# Patient Record
Sex: Male | Born: 1980 | Race: White | Hispanic: No | Marital: Married | Smoking: Never smoker
Health system: Southern US, Community
[De-identification: ages and names within clinical notes are randomized; demographics above are authoritative.]

## PROBLEM LIST (undated history)

## (undated) DIAGNOSIS — IMO0001 Reserved for inherently not codable concepts without codable children: Secondary | ICD-10-CM

## (undated) DIAGNOSIS — Z8719 Personal history of other diseases of the digestive system: Secondary | ICD-10-CM

## (undated) DIAGNOSIS — A6002 Herpesviral infection of other male genital organs: Secondary | ICD-10-CM

## (undated) DIAGNOSIS — T7840XA Allergy, unspecified, initial encounter: Secondary | ICD-10-CM

## (undated) DIAGNOSIS — F419 Anxiety disorder, unspecified: Secondary | ICD-10-CM

## (undated) DIAGNOSIS — I10 Essential (primary) hypertension: Secondary | ICD-10-CM

## (undated) DIAGNOSIS — Z87442 Personal history of urinary calculi: Secondary | ICD-10-CM

## (undated) DIAGNOSIS — T4145XA Adverse effect of unspecified anesthetic, initial encounter: Secondary | ICD-10-CM

## (undated) DIAGNOSIS — T8859XA Other complications of anesthesia, initial encounter: Secondary | ICD-10-CM

## (undated) DIAGNOSIS — J189 Pneumonia, unspecified organism: Secondary | ICD-10-CM

## (undated) DIAGNOSIS — J45909 Unspecified asthma, uncomplicated: Secondary | ICD-10-CM

## (undated) HISTORY — PX: WISDOM TOOTH EXTRACTION: SHX21

## (undated) HISTORY — DX: Allergy, unspecified, initial encounter: T78.40XA

## (undated) HISTORY — DX: Unspecified asthma, uncomplicated: J45.909

## (undated) HISTORY — DX: Essential (primary) hypertension: I10

---

## 2010-08-25 ENCOUNTER — Ambulatory Visit: Payer: BC Managed Care – PPO | Admitting: Gastroenterology

## 2011-10-03 HISTORY — PX: TONSILLECTOMY AND ADENOIDECTOMY: SUR1326

## 2011-10-05 ENCOUNTER — Telehealth: Payer: Self-pay

## 2011-10-05 NOTE — Telephone Encounter (Signed)
Please pull chart and route msg to pa pool

## 2011-10-05 NOTE — Telephone Encounter (Signed)
Pt states he called his pharmacy for refill and they told him they contacted [place] without response. States he has 1 pill left of generic zoloft and cannot come in for ov for a couple of weeks due to his work schedule - asks for temporary refill until he can come in.  Uses [facility]  Pt best: [phone]

## 2011-10-06 MED ORDER — SERTRALINE HCL 100 MG PO TABS: 150.0000 mg | ORAL_TABLET | Freq: Every day | ORAL | Status: DC

## 2011-10-06 NOTE — Telephone Encounter (Signed)
Patient notified

## 2011-10-06 NOTE — Telephone Encounter (Signed)
Chart pulled to PA 

## 2011-10-06 NOTE — Telephone Encounter (Signed)
Sent to pharmacy.  Pt will need an OV before more refills are given.

## 2011-10-26 ENCOUNTER — Ambulatory Visit (INDEPENDENT_AMBULATORY_CARE_PROVIDER_SITE_OTHER): Payer: BC Managed Care – PPO | Admitting: Family Medicine

## 2011-10-26 VITALS — BP 115/73 | HR 75 | Temp 98.1°F | Resp 18 | Ht 70.5 in | Wt 239.8 lb

## 2011-10-26 DIAGNOSIS — J301 Allergic rhinitis due to pollen: Secondary | ICD-10-CM

## 2011-10-26 DIAGNOSIS — I10 Essential (primary) hypertension: Secondary | ICD-10-CM

## 2011-10-26 DIAGNOSIS — J302 Other seasonal allergic rhinitis: Secondary | ICD-10-CM

## 2011-10-26 MED ORDER — OMEPRAZOLE 20 MG PO CPDR: 40.0000 mg | DELAYED_RELEASE_CAPSULE | Freq: Every day | ORAL | Status: DC

## 2011-10-26 MED ORDER — SERTRALINE HCL 100 MG PO TABS: 100.0000 mg | ORAL_TABLET | Freq: Every day | ORAL | Status: DC

## 2011-10-26 MED ORDER — METOPROLOL SUCCINATE ER 25 MG PO TB24: 25.0000 mg | ORAL_TABLET | Freq: Every day | ORAL | Status: DC

## 2011-10-26 MED ORDER — MONTELUKAST SODIUM 10 MG PO TABS: 10.0000 mg | ORAL_TABLET | Freq: Every day | ORAL | Status: DC

## 2011-10-26 NOTE — Progress Notes (Signed)
31 yo with need for refills and evaluation for allergies:  Chronic, year round  Very sensitive to dust and pollen (this time of year is particularly bad)  O:  NAD HEENT:  Unremarkable except cobblestoning of conjunctiva and nasal congestion Chest:  Clear Heart:  Reg, no murmur Abdomen: soft, nontender  A:  Allergies htn  P:  Refill meds  Trial of singulair

## 2011-10-26 NOTE — Patient Instructions (Signed)
Allergies, Generic Allergies may happen from anything your body is sensitive to. This may be food, medicines, pollens, chemicals, and nearly anything around you in everyday life that produces allergens. An allergen is anything that causes an allergy producing substance. Heredity is often a factor in causing these problems. This means you may have some of the same allergies as your parents. Food allergies happen in all age groups. Food allergies are some of the most severe and life threatening. Some common food allergies are cow's milk, seafood, eggs, nuts, wheat, and soybeans. SYMPTOMS   Swelling around the mouth.   An itchy red rash or hives.   Vomiting or diarrhea.   Difficulty breathing.  SEVERE ALLERGIC REACTIONS ARE LIFE-THREATENING. This reaction is called anaphylaxis. It can cause the mouth and throat to swell and cause difficulty with breathing and swallowing. In severe reactions only a trace amount of food (for example, peanut oil in a salad) may cause death within seconds. Seasonal allergies occur in all age groups. These are seasonal because they usually occur during the same season every year. They may be a reaction to molds, grass pollens, or tree pollens. Other causes of problems are house dust mite allergens, pet dander, and mold spores. The symptoms often consist of nasal congestion, a runny itchy nose associated with sneezing, and tearing itchy eyes. There is often an associated itching of the mouth and ears. The problems happen when you come in contact with pollens and other allergens. Allergens are the particles in the air that the body reacts to with an allergic reaction. This causes you to release allergic antibodies. Through a chain of events, these eventually cause you to release histamine into the blood stream. Although it is meant to be protective to the body, it is this release that causes your discomfort. This is why you were given anti-histamines to feel better. If you are  unable to pinpoint the offending allergen, it may be determined by skin or blood testing. Allergies cannot be cured but can be controlled with medicine. Hay fever is a collection of all or some of the seasonal allergy problems. It may often be treated with simple over-the-counter medicine such as diphenhydramine. Take medicine as directed. Do not drink alcohol or drive while taking this medicine. Check with your caregiver or package insert for child dosages. If these medicines are not effective, there are many new medicines your caregiver can prescribe. Stronger medicine such as nasal spray, eye drops, and corticosteroids may be used if the first things you try do not work well. Other treatments such as immunotherapy or desensitizing injections can be used if all else fails. Follow up with your caregiver if problems continue. These seasonal allergies are usually not life threatening. They are generally more of a nuisance that can often be handled using medicine. HOME CARE INSTRUCTIONS   If unsure what causes a reaction, keep a diary of foods eaten and symptoms that follow. Avoid foods that cause reactions.   If hives or rash are present:   Take medicine as directed.   You may use an over-the-counter antihistamine (diphenhydramine) for hives and itching as needed.   Apply cold compresses (cloths) to the skin or take baths in cool water. Avoid hot baths or showers. Heat will make a rash and itching worse.   If you are severely allergic:   Following a treatment for a severe reaction, hospitalization is often required for closer follow-up.   Wear a medic-alert bracelet or necklace stating the allergy.     You and your family must learn how to give adrenaline or use an anaphylaxis kit.   If you have had a severe reaction, always carry your anaphylaxis kit or EpiPen with you. Use this medicine as directed by your caregiver if a severe reaction is occurring. Failure to do so could have a fatal  outcome.  SEEK MEDICAL CARE IF:  You suspect a food allergy. Symptoms generally happen within 30 minutes of eating a food.   Your symptoms have not gone away within 2 days or are getting worse.   You develop new symptoms.   You want to retest yourself or your child with a food or drink you think causes an allergic reaction. Never do this if an anaphylactic reaction to that food or drink has happened before. Only do this under the care of a caregiver.  SEEK IMMEDIATE MEDICAL CARE IF:   You have difficulty breathing, are wheezing, or have a tight feeling in your chest or throat.   You have a swollen mouth, or you have hives, swelling, or itching all over your body.   You have had a severe reaction that has responded to your anaphylaxis kit or an EpiPen. These reactions may return when the medicine has worn off. These reactions should be considered life threatening.  MAKE SURE YOU:   Understand these instructions.   Will watch your condition.   Will get help right away if you are not doing well or get worse.  Document Released: 09/13/2002 Document Revised: 06/09/2011 Document Reviewed: 02/18/2008 Exit[employer] Patient Information 2012 [employer]. 

## 2012-01-10 ENCOUNTER — Other Ambulatory Visit: Payer: Self-pay | Admitting: Physician Assistant

## 2012-01-10 ENCOUNTER — Other Ambulatory Visit: Payer: Self-pay | Admitting: Family Medicine

## 2012-01-10 ENCOUNTER — Telehealth: Payer: Self-pay

## 2012-01-10 MED ORDER — FLUTICASONE PROPIONATE 50 MCG/ACT NA SUSP: 2.0000 | Freq: Every day | NASAL | Status: DC

## 2012-01-10 NOTE — Telephone Encounter (Signed)
PT WOULD LIKE TO REQUEST A REFILL ON FLONASE, PT STATES THAT HE HAD REFILLS ON HIS PREVIOUS RX BUT HE REALIZED TODAY THAT IT WAS EXPIRED. [phone]

## 2012-01-10 NOTE — Telephone Encounter (Signed)
Informed pt that rx sent to pharmacy

## 2012-01-10 NOTE — Telephone Encounter (Signed)
Rx done and sent to pharmacy 

## 2012-02-28 ENCOUNTER — Ambulatory Visit (INDEPENDENT_AMBULATORY_CARE_PROVIDER_SITE_OTHER): Payer: BC Managed Care – PPO | Admitting: Family Medicine

## 2012-02-28 VITALS — BP 141/77 | HR 89 | Temp 98.6°F | Resp 18 | Ht 70.25 in | Wt 242.4 lb

## 2012-02-28 DIAGNOSIS — L0291 Cutaneous abscess, unspecified: Secondary | ICD-10-CM | POA: Insufficient documentation

## 2012-02-28 DIAGNOSIS — R5381 Other malaise: Secondary | ICD-10-CM

## 2012-02-28 DIAGNOSIS — R5383 Other fatigue: Secondary | ICD-10-CM | POA: Insufficient documentation

## 2012-02-28 DIAGNOSIS — L02619 Cutaneous abscess of unspecified foot: Secondary | ICD-10-CM

## 2012-02-28 MED ORDER — DOXYCYCLINE HYCLATE 100 MG PO TABS: 100.0000 mg | ORAL_TABLET | Freq: Two times a day (BID) | ORAL | Status: AC

## 2012-02-28 NOTE — Progress Notes (Signed)
Jonathan Jordan is a 31 y.o. male who presents to [facility] today for   1) sleep study: Patient was told that he needs a sleep study by an oral surgeon when he had mild apneic episodes with heavy snoring during conscious sedation.  He notes daytime fatigue.  He feels well otherwise no chest pains or palpitations. He has well-controlled hypertension with Toprol.  2) abscess: Vision developed a small abscess on the posterior neck a few days ago. Is mildly painful. No fevers chills neck pain neck stiffness. Feels well otherwise.   PMH: Reviewed hypertension History  Substance Use Topics  . Smoking status: Never Smoker   . Smokeless tobacco: Not on file  . Alcohol Use: Not on file   ROS as above  Medications reviewed. Current Outpatient Prescriptions  Medication Sig Dispense Refill  . cetirizine (ZYRTEC) 10 MG tablet Take 10 mg by mouth daily.      . fluticasone (FLONASE) 50 MCG/ACT nasal spray Place 2 sprays into the nose daily.  16 g  12  . metoprolol succinate (TOPROL-XL) 25 MG 24 hr tablet Take 1 tablet (25 mg total) by mouth daily.  90 tablet  2  . omeprazole (PRILOSEC) 20 MG capsule Take 2 capsules (40 mg total) by mouth daily. Two pills once daily  180 capsule  3  . sertraline (ZOLOFT) 100 MG tablet Take 1 tablet (100 mg total) by mouth daily.  90 tablet  3  . doxycycline (VIBRA-TABS) 100 MG tablet Take 1 tablet (100 mg total) by mouth 2 (two) times daily.  14 tablet  0  . montelukast (SINGULAIR) 10 MG tablet Take 1 tablet (10 mg total) by mouth at bedtime.  30 tablet  3    Exam:  BP 141/77  Pulse 89  Temp 98.6 F (37 C) (Oral)  Resp 18  Ht 5' 10.25" (1.784 m)  Wt 242 lb 6.4 oz (109.952 kg)  BMI 34.53 kg/m2  SpO2 99% Gen: Well NAD HEENT: EOMI,  MMM, large circumference neck. MP 3 palate Lungs: CTABL Nl WOB Heart: RRR no MRG Abd: NABS, NT, ND Exts: Non edematous BL  LE, warm and well perfused.  Skin: Small indurated papule and posterior neck. No fluctuants tender to touch. Left  than the size of a dime  No results found for this or any previous visit (from the past 72 hour(s)).  Assessment and Plan: 31 y.o. male with   1) likely sleep apnea: Plan for split-night sleep study  2) abscess: Early developing abscess. 2 early to drain. No signs of systemic illness or further cellulitis. Plan to treat with oral doxycycline. Follow up if worsening

## 2012-02-28 NOTE — Patient Instructions (Addendum)
Thank you for coming in today. 1) Sleep Study: We will call with the date.  2) Abscess: It is not ripe to drain yet. Please take the doxycycline twice a day for 1 week.  If it worsens please come back for draining.  Be on the lookout for fevers or chill

## 2012-03-21 ENCOUNTER — Encounter (HOSPITAL_BASED_OUTPATIENT_CLINIC_OR_DEPARTMENT_OTHER): Payer: BC Managed Care – PPO

## 2012-04-11 ENCOUNTER — Ambulatory Visit (HOSPITAL_BASED_OUTPATIENT_CLINIC_OR_DEPARTMENT_OTHER): Payer: BC Managed Care – PPO

## 2012-06-26 ENCOUNTER — Other Ambulatory Visit: Payer: Self-pay | Admitting: *Deleted

## 2012-06-26 ENCOUNTER — Other Ambulatory Visit: Payer: Self-pay | Admitting: Physician Assistant

## 2012-06-26 MED ORDER — OMEPRAZOLE 20 MG PO CPDR: 40.0000 mg | DELAYED_RELEASE_CAPSULE | Freq: Every day | ORAL | Status: DC

## 2012-08-13 ENCOUNTER — Ambulatory Visit (INDEPENDENT_AMBULATORY_CARE_PROVIDER_SITE_OTHER): Payer: BC Managed Care – PPO | Admitting: Family Medicine

## 2012-08-13 VITALS — BP 136/83 | HR 68 | Temp 98.3°F | Resp 18 | Wt 245.0 lb

## 2012-08-13 DIAGNOSIS — R599 Enlarged lymph nodes, unspecified: Secondary | ICD-10-CM

## 2012-08-13 DIAGNOSIS — R590 Localized enlarged lymph nodes: Secondary | ICD-10-CM

## 2012-08-13 DIAGNOSIS — F329 Major depressive disorder, single episode, unspecified: Secondary | ICD-10-CM

## 2012-08-13 DIAGNOSIS — K219 Gastro-esophageal reflux disease without esophagitis: Secondary | ICD-10-CM

## 2012-08-13 DIAGNOSIS — F32A Depression, unspecified: Secondary | ICD-10-CM

## 2012-08-13 MED ORDER — AZITHROMYCIN 250 MG PO TABS: ORAL_TABLET | ORAL | Status: DC

## 2012-08-13 MED ORDER — OMEPRAZOLE 20 MG PO CPDR: 40.0000 mg | DELAYED_RELEASE_CAPSULE | Freq: Every day | ORAL | Status: DC

## 2012-08-13 MED ORDER — SERTRALINE HCL 100 MG PO TABS: 100.0000 mg | ORAL_TABLET | Freq: Every day | ORAL | Status: DC

## 2012-08-13 NOTE — Progress Notes (Signed)
[facility] [address] [place] [address] [id number]  Date:  08/13/2012   Name:  Jonathan Jordan   DOB:  July 10, 1980   MRN:  [id number]  PCP:  Tally Due, MD    Chief Complaint: Otalgia   History of Present Illness:  Jonathan Jordan is a 32 y.o. very pleasant male patient who presents with the following:  He is here today with left ear pain- he also feels sore along the left jawline.  His symptoms started last night.  He went to the gym and worked out last night- he felt tired halfway through his usual work- out and has just not felt 100% today. "I feel worn out."    He feels tired, a little bit queasy.   No congestion, he notes a mild ST on the left only No cough, no fever He felt nauseated this am but no vomiting    He has a history of elevated BP, but no history of heart problems.  No history of CP or SOB, no CP while exercising  Patient Active Problem List  Diagnosis  . Fatigue  . Abscess    No past medical history on file.  No past surgical history on file.  History  Substance Use Topics  . Smoking status: Never Smoker   . Smokeless tobacco: Not on file  . Alcohol Use: Not on file    No family history on file.  Allergies  Allergen Reactions  . Astepro (Astelin)     Abdominal issues  . Bactrim     Abdominal pain   . Ceclor (Cefaclor) Other (See Comments)    Abdominal pain  . Cephalexin Swelling  . Penicillins Swelling    Medication list has been reviewed and updated.  Current Outpatient Prescriptions on File Prior to Visit  Medication Sig Dispense Refill  . cetirizine (ZYRTEC) 10 MG tablet Take 10 mg by mouth daily.      . fluticasone (FLONASE) 50 MCG/ACT nasal spray Place 2 sprays into the nose daily.  16 g  12  . metoprolol succinate (TOPROL-XL) 25 MG 24 hr tablet Take 1 tablet (25 mg total) by mouth daily.  90 tablet  2  . omeprazole (PRILOSEC) 20 MG capsule Take 2 capsules (40 mg total) by mouth daily.  180  capsule  0  . sertraline (ZOLOFT) 100 MG tablet Take 1 tablet (100 mg total) by mouth daily.  90 tablet  3   No current facility-administered medications on file prior to visit.    Review of Systems:  As per HPI- otherwise negative.   Physical Examination: Filed Vitals:   08/13/12 1522  BP: 136/83  Pulse: 62  Temp: 98.3 F (36.8 C)  Resp: 18   Filed Vitals:   08/13/12 1522  Weight: 245 lb (111.131 kg)   Body mass index is 34.92 kg/(m^2). Ideal Body Weight:    GEN: WDWN, NAD, Non-toxic, A & O x 3 HEENT: Atraumatic, Normocephalic. Neck supple. No masses, No LAD. Bilateral TM wnl, oropharynx injected but no exudate or sign of abscess.  PEERL,EOMI.   No sign of otitis, but he has tender and enlarged nodes at the left jaw angle and in the left anterior cervical chain.   Ears and Nose: No external deformity. CV: RRR, No M/G/R. No JVD. No thrill. No extra heart sounds. PULM: CTA B, no wheezes, crackles, rhonchi. No retractions. No resp. distress. No accessory muscle use. ABD: S, NT, ND EXTR: No c/c/e  NEURO Normal gait.  PSYCH: Normally interactive. Conversant. Not depressed or anxious appearing.  Calm demeanor.   Results for orders placed in visit on 08/13/12  POCT RAPID STREP A (OFFICE)      Result Value Range   Rapid Strep A Screen Negative  Negative    Assessment and Plan: 1. Lymphadenopathy of left cervical region  POCT rapid strep A   POCT rapid strep A   azithromycin (ZITHROMAX) 250 MG tablet  2. Depression  sertraline (ZOLOFT) 100 MG tablet   sertraline (ZOLOFT) 100 MG tablet  3. GERD (gastroesophageal reflux disease)  omeprazole (PRILOSEC) 20 MG capsule   omeprazole (PRILOSEC) 20 MG capsule   Suspect pharyngitis as cause of left ear pain/ ST/ left cervical LAD.  Will treat with azithromycin as he has several allergy issues.  He will let me know if not better in the next couple of days- Sooner if worse.   Refilled his zoloft and prilosec today as well- both are  working well and he does not have current concerns about these issues  COPLAND,JESSICA, MD

## 2012-08-13 NOTE — Patient Instructions (Addendum)
Let me know if you are not feeling better in the next few days- Sooner if worse.  Rest and drink plenty of fluids.

## 2012-08-14 ENCOUNTER — Other Ambulatory Visit: Payer: Self-pay | Admitting: *Deleted

## 2012-08-14 DIAGNOSIS — F32A Depression, unspecified: Secondary | ICD-10-CM

## 2012-08-14 DIAGNOSIS — F329 Major depressive disorder, single episode, unspecified: Secondary | ICD-10-CM

## 2012-08-14 MED ORDER — SERTRALINE HCL 100 MG PO TABS: 100.0000 mg | ORAL_TABLET | Freq: Every day | ORAL | Status: DC

## 2012-09-28 ENCOUNTER — Other Ambulatory Visit: Payer: Self-pay | Admitting: Family Medicine

## 2012-10-07 ENCOUNTER — Encounter: Payer: Self-pay | Admitting: Family Medicine

## 2012-10-09 ENCOUNTER — Ambulatory Visit (INDEPENDENT_AMBULATORY_CARE_PROVIDER_SITE_OTHER): Payer: BC Managed Care – PPO | Admitting: Family Medicine

## 2012-10-09 VITALS — BP 128/88 | HR 82 | Temp 98.8°F | Resp 16 | Ht 71.0 in | Wt 242.0 lb

## 2012-10-09 DIAGNOSIS — E669 Obesity, unspecified: Secondary | ICD-10-CM

## 2012-10-09 DIAGNOSIS — I10 Essential (primary) hypertension: Secondary | ICD-10-CM

## 2012-10-09 DIAGNOSIS — R5383 Other fatigue: Secondary | ICD-10-CM

## 2012-10-09 DIAGNOSIS — J309 Allergic rhinitis, unspecified: Secondary | ICD-10-CM

## 2012-10-09 DIAGNOSIS — J019 Acute sinusitis, unspecified: Secondary | ICD-10-CM

## 2012-10-09 DIAGNOSIS — R590 Localized enlarged lymph nodes: Secondary | ICD-10-CM

## 2012-10-09 MED ORDER — METOPROLOL SUCCINATE ER 25 MG PO TB24: 25.0000 mg | ORAL_TABLET | Freq: Every day | ORAL | Status: DC

## 2012-10-09 MED ORDER — AZITHROMYCIN 250 MG PO TABS: ORAL_TABLET | ORAL | Status: DC

## 2012-10-09 MED ORDER — IPRATROPIUM BROMIDE 0.03 % NA SOLN: 2.0000 | Freq: Four times a day (QID) | NASAL | Status: DC

## 2012-10-09 MED ORDER — FLUTICASONE PROPIONATE 50 MCG/ACT NA SUSP: 2.0000 | Freq: Every day | NASAL | Status: DC

## 2012-10-09 MED ORDER — CETIRIZINE HCL 10 MG PO TABS: 10.0000 mg | ORAL_TABLET | Freq: Every day | ORAL | Status: AC

## 2012-10-09 NOTE — Progress Notes (Signed)
Subjective:    Patient ID: Jonathan Jordan, male    DOB: [birth year], 32 y.o.   MRN: [id number] Chief Complaint  Patient presents with  . Nasal Congestion  . Sore Throat    HPI  Dr. Alwyn Ren ordered a sleep study at [facility] but pt works in [place] but he lives in [place], [place] so it was to difficult to make the trip stay in [place] o/n - [facility] [place] and they told him that they would be happy to do his sleep study there - just needed an order from his doctor - or he could go to [facility].  He prefers any Wed/thurs - sooner the better. Feeling very tired even after sleeping for 12 hours and sleeps easily during the day.  Can nap at the drop of a hat.  Was told by his dentist while he was under conscious sedation that he had repeated severe apnea episodes.  flonase and zyrtec daily is helping but last night illness hit him severe w/ rhinitis and hoarse voice and post-nasal drip, sore throat. Slight dry cough, a lot of congesitons, a lot of sinus pressure and HAs, fever over night w/ diaphoresis.  Past Medical History  Diagnosis Date  . Allergy   . Asthma   . Hypertension    Current Outpatient Prescriptions on File Prior to Visit  Medication Sig Dispense Refill  . omeprazole (PRILOSEC) 20 MG capsule TAKE 2 CAPSULES BY MOUTH DAILY  180 capsule  1  . sertraline (ZOLOFT) 100 MG tablet Take 1 tablet (100 mg total) by mouth daily.  90 tablet  3   No current facility-administered medications on file prior to visit.   Allergies  Allergen Reactions  . Astepro (Astelin)     Abdominal issues  . Bactrim     Abdominal pain   . Ceclor (Cefaclor) Other (See Comments)    Abdominal pain  . Cephalexin Swelling  . Penicillins Swelling     Review of Systems  Constitutional: Positive for fever, chills, diaphoresis and fatigue. Negative for activity change, appetite change and unexpected weight change.  HENT: Positive for congestion, sore throat, rhinorrhea, sneezing, voice change,  postnasal drip and sinus pressure. Negative for ear pain, mouth sores, neck pain, neck stiffness and dental problem.   Respiratory: Positive for cough. Negative for shortness of breath.   Cardiovascular: Negative for chest pain.  Gastrointestinal: Negative for nausea, vomiting, abdominal pain, diarrhea and constipation.  Genitourinary: Negative for dysuria.  Musculoskeletal: Negative for myalgias and arthralgias.  Skin: Negative for rash.  Neurological: Positive for headaches. Negative for syncope.  Hematological: Negative for adenopathy.  Psychiatric/Behavioral: Positive for sleep disturbance.      BP 128/88  Pulse 82  Temp(Src) 98.8 F (37.1 C) (Oral)  Resp 16  Ht 5\' 11"  (1.803 m)  Wt 242 lb (109.77 kg)  BMI 33.77 kg/m2  SpO2 98% Objective:   Physical Exam  Constitutional: He is oriented to person, place, and time. He appears well-developed and well-nourished. No distress.  HENT:  Head: Normocephalic and atraumatic.  Right Ear: External ear and ear canal normal. Tympanic membrane is not erythematous and not retracted. A middle ear effusion is present.  Left Ear: Tympanic membrane, external ear and ear canal normal. Tympanic membrane is not erythematous and not retracted.  No middle ear effusion.  Nose: Mucosal edema present. No rhinorrhea. Right sinus exhibits no maxillary sinus tenderness and no frontal sinus tenderness. Left sinus exhibits no maxillary sinus tenderness and no frontal  sinus tenderness.  Mouth/Throat: Uvula is midline and mucous membranes are normal. Posterior oropharyngeal edema present. No oropharyngeal exudate, posterior oropharyngeal erythema or tonsillar abscesses.  Small posterior oropharyngeal space w/ 1+ tonsils  Eyes: Conjunctivae are normal. Right eye exhibits no discharge. Left eye exhibits no discharge. No scleral icterus.  Neck: Normal range of motion. Neck supple. No thyromegaly present.  Cardiovascular: Normal rate, regular rhythm, normal heart  sounds and intact distal pulses.   Pulmonary/Chest: Effort normal and breath sounds normal. No respiratory distress.  Lymphadenopathy:       Head (right side): Submandibular adenopathy present.       Head (left side): Submandibular adenopathy present.    He has no cervical adenopathy.       Right: No supraclavicular adenopathy present.       Left: No supraclavicular adenopathy present.  Neurological: He is alert and oriented to person, place, and time.  Skin: Skin is warm and dry. He is not diaphoretic. No erythema.  Psychiatric: He has a normal mood and affect. His behavior is normal.      Assessment & Plan:  Sinusitis, acute - Currently congestion appears due to allergies - i do not see any signs on exam of bacterial infection so try atrovent and sudafed in addition to zyrtec and flonase - see pt instructions. If sxs worsen with fever/chills/sinus pressure/ear pressure, etc, then can fill paper rx for zpack that I gave him today.  Allergic rhinitis  Essential hypertension, benign - Plan: Ambulatory referral to Sleep Studies - will likely be able to stop metoprolol soon - esp if he gets started on CPAP  Other malaise and fatigue - Plan: Ambulatory referral to Sleep Studies - place referral to somewhere in [place] for split night sleep study w/ CPAP titration if indicated.  Meds ordered this encounter  Medications  . azithromycin (ZITHROMAX) 250 MG tablet    Sig: Use as a zpack    Dispense:  6 tablet    Refill:  0  . cetirizine (ZYRTEC) 10 MG tablet    Sig: Take 1 tablet (10 mg total) by mouth daily.    Dispense:  30 tablet    Refill:  11  . fluticasone (FLONASE) 50 MCG/ACT nasal spray    Sig: Place 2 sprays into the nose daily.    Dispense:  16 g    Refill:  12  . metoprolol succinate (TOPROL-XL) 25 MG 24 hr tablet    Sig: Take 1 tablet (25 mg total) by mouth daily.    Dispense:  90 tablet    Refill:  2  . ipratropium (ATROVENT) 0.03 % nasal spray    Sig: Place 2 sprays  into the nose 4 (four) times daily.    Dispense:  30 mL    Refill:  1

## 2012-10-09 NOTE — Patient Instructions (Signed)
Hot showers or breathing in steam may help loosen the congestion.  Using a netti pot or sinus rinse is also likely to help you feel better and keep this from progressing.  Use the atrovent nasal spray as needed throughout the day and use the fluticasone nasal spray every night before bed for at least 2 weeks.  I recommend augmenting with 12 hr sudafed (behind the counter) and generic mucinex to help you move out the congestion. Your blood pressure is very well controlled so you should do ok on some sudafed in the short term. If your symptoms worsen, you can fill the zpack. If no improvement or you are getting worse, come back as you might need a course of steroids but hopefully with all of the above, you can avoid it.

## 2012-10-12 ENCOUNTER — Telehealth: Payer: Self-pay

## 2012-10-12 NOTE — Telephone Encounter (Signed)
Patient states he has headache nasal congestion. He is advised to add to this mucinex., he did already get the Z pack. He states he has paperwork for missed days at work, wants to know if you will fill this out for him, he has missed 4 work days.

## 2012-10-12 NOTE — Telephone Encounter (Signed)
Pt came in other day and was told if wasn't feeling any better to call back to see what should be done. Call back number [phone]

## 2012-10-14 ENCOUNTER — Ambulatory Visit: Payer: BC Managed Care – PPO

## 2012-10-14 ENCOUNTER — Ambulatory Visit (INDEPENDENT_AMBULATORY_CARE_PROVIDER_SITE_OTHER): Payer: BC Managed Care – PPO | Admitting: Emergency Medicine

## 2012-10-14 VITALS — BP 138/98 | HR 96 | Temp 98.0°F | Resp 16 | Ht 71.0 in | Wt 244.8 lb

## 2012-10-14 DIAGNOSIS — J329 Chronic sinusitis, unspecified: Secondary | ICD-10-CM

## 2012-10-14 MED ORDER — LEVOFLOXACIN 500 MG PO TABS: 500.0000 mg | ORAL_TABLET | Freq: Every day | ORAL | Status: DC

## 2012-10-14 MED ORDER — CLINDAMYCIN HCL 300 MG PO CAPS: 300.0000 mg | ORAL_CAPSULE | Freq: Four times a day (QID) | ORAL | Status: DC

## 2012-10-14 NOTE — Progress Notes (Signed)
  Subjective:    Patient ID: Jonathan Jordan, male    DOB: 1981-05-21, 32 y.o.   MRN: [id number]  HPI Pt saw Dr. Clelia Croft on Tues and was put on a ZPak, sudafed, netti pott, and mucinex. Not feeling any better. HA, PND, congestion, nausea, and cough. Feels terrible, fatigue Usually takes Zyrtec and Flonase with no problems. Has had pneumonia 4 different times. Has been on Prednisone in the past, but it's been awhile.   Review of Systems     Objective:   Physical Exam TMs are dull bilaterally. Nose exam shows a deviated septum. Posterior pharynx shows a high arched palate with large tonsils. Neck is otherwise supple there is no adenopathy noted. 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        Assessment & Plan:  Patient to be on Z-Pak. He needs to see ENT to consider surgery for sinus infection. I put him on a 14 day course of Levaquin.

## 2012-10-15 NOTE — Telephone Encounter (Signed)
Looks like pt was seen on 4/13 so hopefully issues were resolved then.

## 2012-10-31 ENCOUNTER — Other Ambulatory Visit: Payer: Self-pay | Admitting: Otolaryngology

## 2013-02-04 ENCOUNTER — Ambulatory Visit (INDEPENDENT_AMBULATORY_CARE_PROVIDER_SITE_OTHER): Payer: BC Managed Care – PPO | Admitting: Emergency Medicine

## 2013-02-04 VITALS — BP 158/100 | HR 84 | Temp 98.0°F | Resp 16 | Ht 72.0 in | Wt 248.0 lb

## 2013-02-04 DIAGNOSIS — R300 Dysuria: Secondary | ICD-10-CM

## 2013-02-04 LAB — POCT CBC
HCT, POC: 44 % (ref 43.5–53.7)
Hemoglobin: 14.3 g/dL (ref 14.1–18.1)
Lymph, poc: 2.2 (ref 0.6–3.4)
MCHC: 32.5 g/dL (ref 31.8–35.4)
MCV: 84.2 fL (ref 80–97)
POC Granulocyte: 4.8 (ref 2–6.9)
POC LYMPH PERCENT: 29.2 %L (ref 10–50)
RDW, POC: 13.5 %

## 2013-02-04 LAB — POCT UA - MICROSCOPIC ONLY
Bacteria, U Microscopic: NEGATIVE
Casts, Ur, LPF, POC: NEGATIVE
Crystals, Ur, HPF, POC: NEGATIVE
Epithelial cells, urine per micros: NEGATIVE
Mucus, UA: NEGATIVE
RBC, urine, microscopic: NEGATIVE
WBC, Ur, HPF, POC: NEGATIVE
Yeast, UA: NEGATIVE

## 2013-02-04 LAB — POCT URINALYSIS DIPSTICK
Bilirubin, UA: NEGATIVE
Blood, UA: NEGATIVE
Glucose, UA: NEGATIVE
Ketones, UA: NEGATIVE
Leukocytes, UA: NEGATIVE
Nitrite, UA: NEGATIVE
Protein, UA: NEGATIVE
Spec Grav, UA: 1.02
Urobilinogen, UA: 0.2
pH, UA: 6

## 2013-02-04 NOTE — Progress Notes (Signed)
[facility] [address] [place] [address] [phone]  Date:  02/04/2013   Name:  Jonathan Jordan   DOB:  1981-01-19   MRN:  [id number]  PCP:  Tally Due, MD    Chief Complaint: Dysuria   History of Present Illness:  WAQAS Jordan is a 32 y.o. very pleasant male patient who presents with the following:  Has dysuria and itching and burning in penis.  No discharge.  Treated in an urgent care and had negative UA and treated with doxy.  Never tested for STD. History of 2-3 years ago for vesicular eruption that sounds suspiciously like herpes.  Never treated. No prior history of std treatment.  No improvement with over the counter medications or other home remedies. Denies other complaint or health concern today.   Patient Active Problem List   Diagnosis Date Noted  . Fatigue 02/28/2012  . Abscess 02/28/2012    Past Medical History  Diagnosis Date  . Allergy   . Asthma   . Hypertension     History reviewed. No pertinent past surgical history.  History  Substance Use Topics  . Smoking status: Never Smoker   . Smokeless tobacco: Not on file  . Alcohol Use: Not on file    Family History  Problem Relation Age of Onset  . Cancer Maternal Grandmother   . Cancer Paternal Grandmother     Allergies  Allergen Reactions  . Astepro (Astelin)     Abdominal issues  . Bactrim     Abdominal pain   . Ceclor (Cefaclor) Other (See Comments)    Abdominal pain  . Cephalexin Swelling  . Penicillins Swelling    Medication list has been reviewed and updated.  Current Outpatient Prescriptions on File Prior to Visit  Medication Sig Dispense Refill  . cetirizine (ZYRTEC) 10 MG tablet Take 1 tablet (10 mg total) by mouth daily.  30 tablet  11  . fluticasone (FLONASE) 50 MCG/ACT nasal spray Place 2 sprays into the nose daily.  16 g  12  . metoprolol succinate (TOPROL-XL) 25 MG 24 hr tablet Take 1 tablet (25 mg total) by mouth daily.  90 tablet  2  .  omeprazole (PRILOSEC) 20 MG capsule TAKE 2 CAPSULES BY MOUTH DAILY  180 capsule  1  . sertraline (ZOLOFT) 100 MG tablet Take 1 tablet (100 mg total) by mouth daily.  90 tablet  3  . azithromycin (ZITHROMAX) 250 MG tablet Use as a zpack  6 tablet  0  . clindamycin (CLEOCIN) 300 MG capsule Take 1 capsule (300 mg total) by mouth 4 (four) times daily.  40 capsule  0  . ipratropium (ATROVENT) 0.03 % nasal spray Place 2 sprays into the nose 4 (four) times daily.  30 mL  1   No current facility-administered medications on file prior to visit.    Review of Systems:  As per HPI, otherwise negative.    Physical Examination: Filed Vitals:   02/04/13 1530  BP: 158/100  Pulse: 84  Temp: 98 F (36.7 C)  Resp: 16   Filed Vitals:   02/04/13 1530  Height: 6' (1.829 m)  Weight: 248 lb (112.492 kg)   Body mass index is 33.63 kg/(m^2). Ideal Body Weight: Weight in (lb) to have BMI = 25: 183.9   GEN: WDWN, NAD, Non-toxic, Alert & Oriented x 3 HEENT: Atraumatic, Normocephalic.  Ears and Nose: No external deformity. EXTR: No clubbing/cyanosis/edema NEURO: Normal gait.  PSYCH: Normally  interactive. Conversant. Not depressed or anxious appearing.  Calm demeanor.  Genitalia:  Normal male circumcised   Assessment and Plan: STD exposure   Signed,  Phillips Odor, MD

## 2013-02-05 ENCOUNTER — Telehealth: Payer: Self-pay

## 2013-02-05 LAB — GC/CHLAMYDIA PROBE AMP
CT Probe RNA: NEGATIVE
GC Probe RNA: NEGATIVE

## 2013-02-05 LAB — HSV(HERPES SIMPLEX VRS) I + II AB-IGG: HSV 2 Glycoprotein G Ab, IgG: 9.76 IV — ABNORMAL HIGH

## 2013-02-05 LAB — RPR

## 2013-02-05 NOTE — Telephone Encounter (Signed)
Pt is calling to see about his test results  Call back number is [phone]

## 2013-02-05 NOTE — Telephone Encounter (Signed)
This patient was seen by Dr. Dareen Piano

## 2013-02-06 LAB — HERPES SIMPLEX VIRUS CULTURE: Organism ID, Bacteria: NOT DETECTED

## 2013-02-06 MED ORDER — VALACYCLOVIR HCL 1 G PO TABS: 1000.0000 mg | ORAL_TABLET | Freq: Every day | ORAL | Status: DC

## 2013-02-06 NOTE — Telephone Encounter (Signed)
Thanks I have called him to advise.  

## 2013-02-06 NOTE — Telephone Encounter (Signed)
called

## 2013-02-06 NOTE — Telephone Encounter (Signed)
Please let patient know he has herpes genitalis and should take valtrex daily.  Follow up in the office if he wishes to discuss it.  Treatable but no cure.  meds to control and limit outbreaks

## 2013-02-06 NOTE — Telephone Encounter (Signed)
This is done.

## 2013-02-12 ENCOUNTER — Encounter: Payer: Self-pay | Admitting: Emergency Medicine

## 2013-04-12 ENCOUNTER — Other Ambulatory Visit: Payer: Self-pay

## 2013-04-12 DIAGNOSIS — F32A Depression, unspecified: Secondary | ICD-10-CM

## 2013-04-12 DIAGNOSIS — F329 Major depressive disorder, single episode, unspecified: Secondary | ICD-10-CM

## 2013-04-12 MED ORDER — SERTRALINE HCL 100 MG PO TABS: 100.0000 mg | ORAL_TABLET | Freq: Every day | ORAL | Status: DC

## 2013-05-09 ENCOUNTER — Other Ambulatory Visit: Payer: Self-pay

## 2013-07-08 ENCOUNTER — Telehealth: Payer: Self-pay

## 2013-07-08 NOTE — Telephone Encounter (Signed)
Patient needs a refill on Prilosec  [phone]

## 2013-07-10 ENCOUNTER — Telehealth: Payer: Self-pay

## 2013-07-10 ENCOUNTER — Other Ambulatory Visit: Payer: Self-pay | Admitting: *Deleted

## 2013-07-10 MED ORDER — OMEPRAZOLE 20 MG PO CPDR: DELAYED_RELEASE_CAPSULE | ORAL | Status: DC

## 2013-07-10 NOTE — Telephone Encounter (Signed)
PT STATES WE HAVE OKAYED HIS PRESCRIPTION FOR HIM BUT WAS TOLD BY THE PHARMACY IT REQUIRES A PRIOR AUTHORIZATION. PLEASE CALL PT AT [phone]    [facility] [place]

## 2013-07-10 NOTE — Telephone Encounter (Signed)
Spoke to pt. Advised refill sent. Needs OV for further refills.

## 2013-07-12 NOTE — Telephone Encounter (Signed)
Completed PA on covermymeds

## 2013-07-14 NOTE — Telephone Encounter (Signed)
Called pt and informed him that we sent the paperwoek to his insurance and is awaiting a response. Pt understood. Forward to WittmannBarbara to check status.

## 2013-07-15 NOTE — Telephone Encounter (Signed)
[facility] faxed that PA is not required and this med is covered w/out a PA, instr'd pharm to reprocess Rx. Faxed message to pharm and notified pt on VM

## 2013-07-17 ENCOUNTER — Telehealth: Payer: Self-pay

## 2013-07-17 DIAGNOSIS — R0683 Snoring: Secondary | ICD-10-CM

## 2013-07-17 DIAGNOSIS — R400 Somnolence: Secondary | ICD-10-CM

## 2013-07-17 NOTE — Telephone Encounter (Signed)
Dr Dareen PianoAnderson, pt asked me to see if you would re-order a sleep study we had order for him a little over a year ago. He had some other issues come up and could not go through w/study at the time but would like to now. Dr Alwyn RenHopper had ordered it originally d/t pt having daytime fatigue and oral surgeon had reported that pt had mild apneic episodes w/heavy snoring during conscious sedation. Pt stated he has been seeing you as his provider recently.

## 2013-07-18 NOTE — Telephone Encounter (Signed)
Ordered and notified pt on VM.

## 2013-07-18 NOTE — Telephone Encounter (Signed)
Yes, please.

## 2013-07-19 ENCOUNTER — Other Ambulatory Visit: Payer: Self-pay | Admitting: Family Medicine

## 2013-07-21 ENCOUNTER — Telehealth: Payer: Self-pay

## 2013-07-21 DIAGNOSIS — F32A Depression, unspecified: Secondary | ICD-10-CM

## 2013-07-21 DIAGNOSIS — F329 Major depressive disorder, single episode, unspecified: Secondary | ICD-10-CM

## 2013-07-21 NOTE — Telephone Encounter (Signed)
Patient states that his prescription for Sertraline 100mg  has to be filled for 90 days instead of 30 days per his insurance.  [phone]

## 2013-07-22 ENCOUNTER — Ambulatory Visit (INDEPENDENT_AMBULATORY_CARE_PROVIDER_SITE_OTHER): Payer: BC Managed Care – PPO | Admitting: Emergency Medicine

## 2013-07-22 VITALS — BP 118/82 | HR 76 | Temp 98.1°F | Resp 18 | Ht 72.0 in | Wt 255.0 lb

## 2013-07-22 DIAGNOSIS — F32A Depression, unspecified: Secondary | ICD-10-CM

## 2013-07-22 DIAGNOSIS — G4710 Hypersomnia, unspecified: Secondary | ICD-10-CM

## 2013-07-22 DIAGNOSIS — F329 Major depressive disorder, single episode, unspecified: Secondary | ICD-10-CM

## 2013-07-22 DIAGNOSIS — A6000 Herpesviral infection of urogenital system, unspecified: Secondary | ICD-10-CM

## 2013-07-22 DIAGNOSIS — F3289 Other specified depressive episodes: Secondary | ICD-10-CM

## 2013-07-22 DIAGNOSIS — I10 Essential (primary) hypertension: Secondary | ICD-10-CM

## 2013-07-22 DIAGNOSIS — G4730 Sleep apnea, unspecified: Secondary | ICD-10-CM

## 2013-07-22 MED ORDER — FLUTICASONE PROPIONATE 50 MCG/ACT NA SUSP: 2.0000 | Freq: Every day | NASAL | Status: DC

## 2013-07-22 MED ORDER — SERTRALINE HCL 100 MG PO TABS: 100.0000 mg | ORAL_TABLET | Freq: Every day | ORAL | Status: DC

## 2013-07-22 MED ORDER — OMEPRAZOLE 20 MG PO CPDR: DELAYED_RELEASE_CAPSULE | ORAL | Status: DC

## 2013-07-22 MED ORDER — VALACYCLOVIR HCL 1 G PO TABS: 1000.0000 mg | ORAL_TABLET | Freq: Every day | ORAL | Status: DC

## 2013-07-22 MED ORDER — METOPROLOL SUCCINATE ER 25 MG PO TB24: 25.0000 mg | ORAL_TABLET | Freq: Every day | ORAL | Status: DC

## 2013-07-22 NOTE — Telephone Encounter (Signed)
Notified pt on VM that we can only send RF for 30 days d/t it being since last Feb that he has been seen for this. Advised pt if his ins will only pay for 90 days, he should come on in to be seen so that we can send in the 90 day Rx. Gave pt our walk-in hours.

## 2013-07-22 NOTE — Telephone Encounter (Signed)
Spoke with pt, his pharmacy states he only has a 30 day supply. Can we resend Rx for 90 days[facility] [place]

## 2013-07-22 NOTE — Telephone Encounter (Signed)
I can only write for a 30d supply because we have not seen the patient for eval of the problem that he is on the Lexapro for.  After we see him then we can write for a 90d supply.

## 2013-07-22 NOTE — Addendum Note (Signed)
Addended by: Carmelina DaneANDERSON, Hayde Kilgour S on: 07/22/2013 08:02 PM   Modules accepted: Orders

## 2013-07-22 NOTE — Progress Notes (Signed)
[facility] [address], [place] [id number] [phone]  Date:  07/22/2013   Name:  Jonathan SimmersScott B Toothaker   DOB:  01/17/1981   MRN:  [id number]  PCP:  Tally DueGUEST, CHRIS WARREN, MD    Chief Complaint: rx refills and Weight Gain   History of Present Illness:  Jonathan SimmersScott B Sawchuk is a 33 y.o. very pleasant male patient who presents with the following:  History of hypertension.  Needs refills on medication. Has no end organ symptoms.  No improvement with over the counter medications or other home remedies. Denies other complaint or health concern today.  No further outbreaks of herpes.    Patient Active Problem List   Diagnosis Date Noted  . Fatigue 02/28/2012  . Abscess 02/28/2012    Past Medical History  Diagnosis Date  . Allergy   . Asthma   . Hypertension     History reviewed. No pertinent past surgical history.  History  Substance Use Topics  . Smoking status: Never Smoker   . Smokeless tobacco: Not on file  . Alcohol Use: Not on file    Family History  Problem Relation Age of Onset  . Cancer Maternal Grandmother   . Cancer Paternal Grandmother     Allergies  Allergen Reactions  . Astepro [Astelin]     Abdominal issues  . Bactrim     Abdominal pain   . Ceclor [Cefaclor] Other (See Comments)    Abdominal pain  . Cephalexin Swelling  . Penicillins Swelling    Medication list has been reviewed and updated.  Current Outpatient Prescriptions on File Prior to Visit  Medication Sig Dispense Refill  . cetirizine (ZYRTEC) 10 MG tablet Take 1 tablet (10 mg total) by mouth daily.  30 tablet  11  . fluticasone (FLONASE) 50 MCG/ACT nasal spray Place 2 sprays into the nose daily.  16 g  12  . metoprolol succinate (TOPROL-XL) 25 MG 24 hr tablet Take 1 tablet (25 mg total) by mouth daily.  90 tablet  2  . omeprazole (PRILOSEC) 20 MG capsule TAKE 2 CAPSULES BY MOUTH DAILY**NEEDS OFFICE VISIT**  180 capsule  0  . sertraline (ZOLOFT) 100 MG tablet Take 1  tablet (100 mg total) by mouth daily.  30 tablet  0  . valACYclovir (VALTREX) 1000 MG tablet Take 1 tablet (1,000 mg total) by mouth daily.  30 tablet  12  . azithromycin (ZITHROMAX) 250 MG tablet Use as a zpack  6 tablet  0  . clindamycin (CLEOCIN) 300 MG capsule Take 1 capsule (300 mg total) by mouth 4 (four) times daily.  40 capsule  0  . ipratropium (ATROVENT) 0.03 % nasal spray Place 2 sprays into the nose 4 (four) times daily.  30 mL  1   No current facility-administered medications on file prior to visit.    Review of Systems:  As per HPI, otherwise negative.    Physical Examination: Filed Vitals:   07/22/13 1855  BP: 118/82  Pulse: 76  Temp: 98.1 F (36.7 C)  Resp: 18   Filed Vitals:   07/22/13 1855  Height: 6' (1.829 m)  Weight: 255 lb (115.667 kg)   Body mass index is 34.58 kg/(m^2). Ideal Body Weight: Weight in (lb) to have BMI = 25: 183.9  GEN: WDWN, NAD, Non-toxic, A & O x 3 HEENT: Atraumatic, Normocephalic. Neck supple. No masses, No LAD. Ears and Nose: No external deformity. CV: RRR, No M/G/R. No JVD. No thrill. No extra  heart sounds. PULM: CTA B, no wheezes, crackles, rhonchi. No retractions. No resp. distress. No accessory muscle use. ABD: S, NT, ND, +BS. No rebound. No HSM. EXTR: No c/c/e NEURO Normal gait.  PSYCH: Normally interactive. Conversant. Not depressed or anxious appearing.  Calm demeanor.    Assessment and Plan: Hypertension  Herpes genitalis   Signed,  Phillips Odor, MD

## 2013-07-22 NOTE — Patient Instructions (Signed)

## 2013-07-22 NOTE — Telephone Encounter (Signed)
It was written for 90 days with one refill on 04/16/2014. LMOM to CB. Get details.

## 2013-07-30 ENCOUNTER — Institutional Professional Consult (permissible substitution): Payer: BC Managed Care – PPO | Admitting: Neurology

## 2013-09-24 ENCOUNTER — Institutional Professional Consult (permissible substitution): Payer: BC Managed Care – PPO | Admitting: Neurology

## 2014-06-21 ENCOUNTER — Other Ambulatory Visit: Payer: Self-pay | Admitting: Emergency Medicine

## 2014-07-18 ENCOUNTER — Telehealth: Payer: Self-pay

## 2014-07-18 DIAGNOSIS — F329 Major depressive disorder, single episode, unspecified: Secondary | ICD-10-CM

## 2014-07-18 DIAGNOSIS — F32A Depression, unspecified: Secondary | ICD-10-CM

## 2014-07-18 NOTE — Telephone Encounter (Signed)
Patient of Dr. Dareen PianoAnderson. Says he has relocated and would like a 30 day supply of the sertraline until he is able to get an appt with his new doctor.   CB# [phone]  [facility] [place]

## 2014-07-21 MED ORDER — SERTRALINE HCL 100 MG PO TABS: 100.0000 mg | ORAL_TABLET | Freq: Every day | ORAL | Status: DC

## 2014-07-21 NOTE — Telephone Encounter (Signed)
Sent 1 mos RF and notified pt. He agreed to find new provider there for add'l RFs.

## 2015-01-19 IMAGING — CR DG SINUSES 1-2V
1 series · 1 of 1 positions shown · non-contrast
Comparison: None.

CLINICAL DATA: Congestion, nausea and cough.

PARANASAL SINUSES - 1-2 VIEW

[waters]
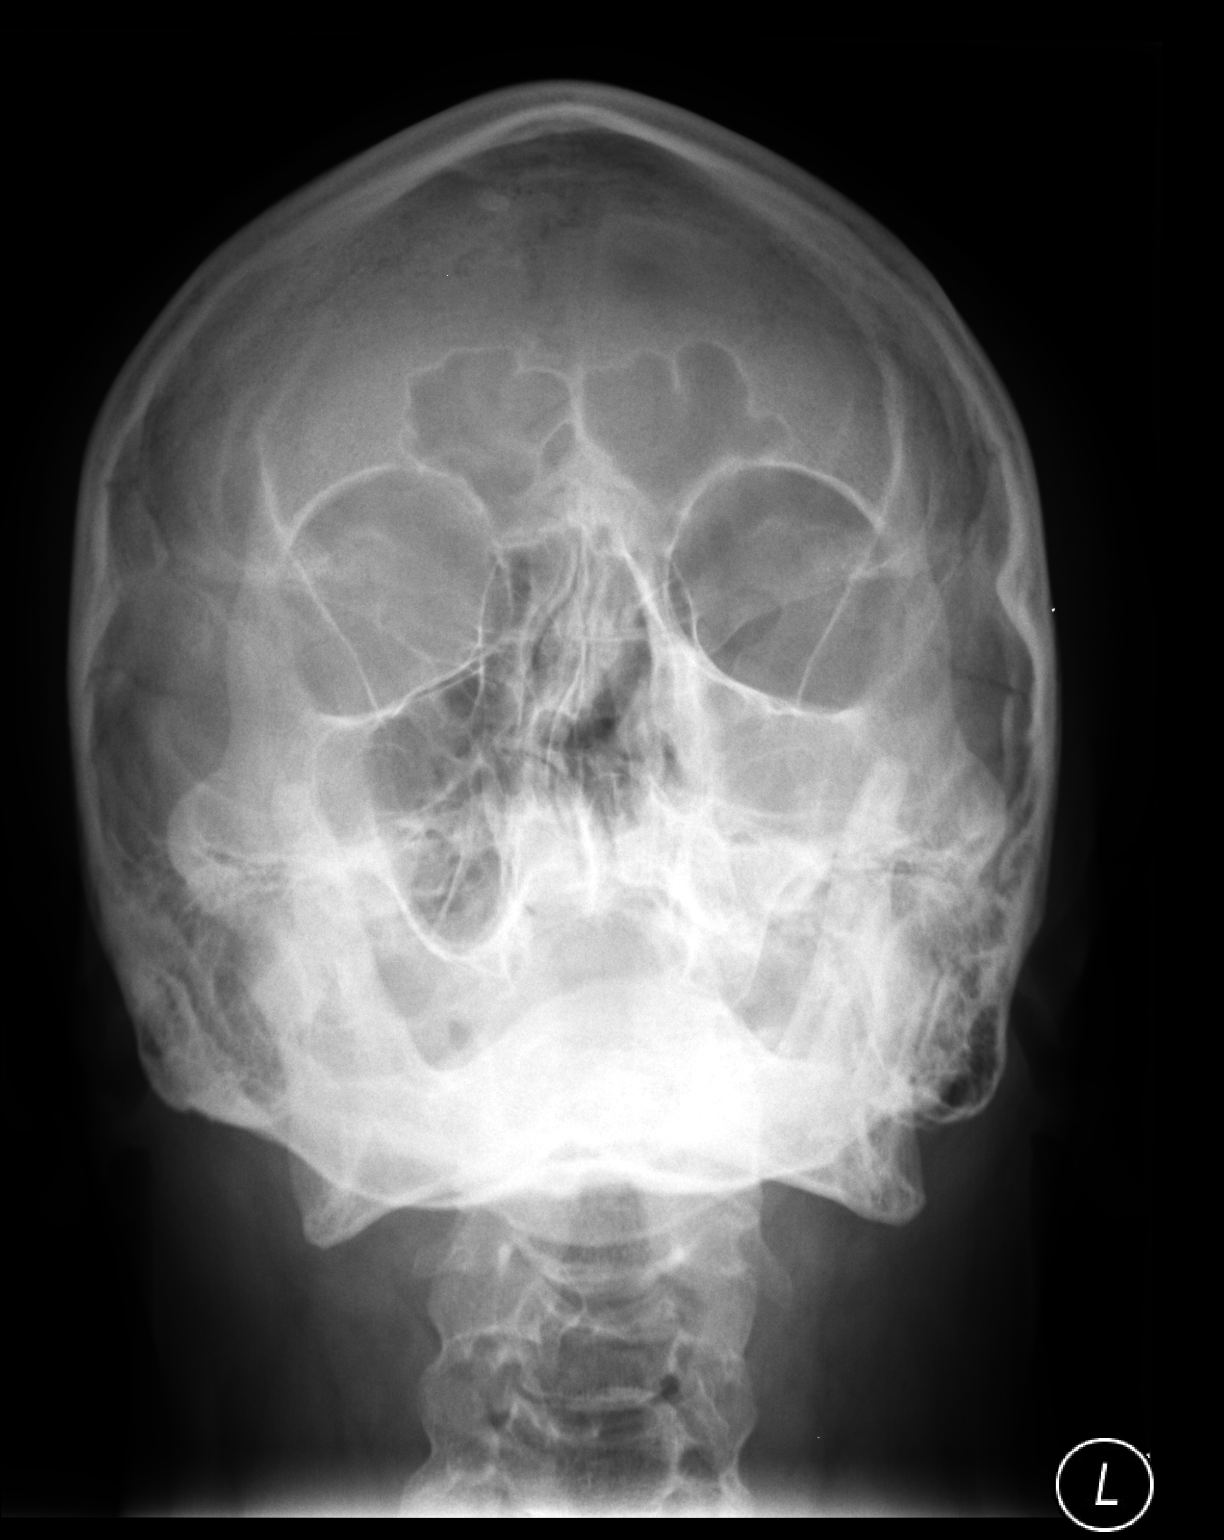

[1 of 1 positions shown; findings below may reference images not displayed]

FINDINGS: There is complete opacification of the left maxillary
sinus.  Paranasal sinuses otherwise appear clear.  No focal bony
abnormality.
IMPRESSION: Complete opacification left maxillary sinus.

## 2016-07-04 HISTORY — PX: UPPER GASTROINTESTINAL ENDOSCOPY: SHX188

## 2016-08-17 HISTORY — PX: SINUS SURGERY WITH INSTATRAK: SHX5215

## 2017-03-14 HISTORY — PX: HIATAL HERNIA REPAIR: SHX195

## 2017-03-14 HISTORY — PX: NISSEN FUNDOPLICATION: SHX2091

## 2017-04-05 ENCOUNTER — Ambulatory Visit (INDEPENDENT_AMBULATORY_CARE_PROVIDER_SITE_OTHER): Payer: BLUE CROSS/BLUE SHIELD | Admitting: Pulmonary Disease

## 2017-04-05 ENCOUNTER — Ambulatory Visit (INDEPENDENT_AMBULATORY_CARE_PROVIDER_SITE_OTHER)
Admission: RE | Admit: 2017-04-05 | Discharge: 2017-04-05 | Disposition: A | Discharge status: Home / Self Care | Payer: BLUE CROSS/BLUE SHIELD | Source: Ambulatory Visit | Attending: Pulmonary Disease | Admitting: Pulmonary Disease

## 2017-04-05 ENCOUNTER — Other Ambulatory Visit (INDEPENDENT_AMBULATORY_CARE_PROVIDER_SITE_OTHER): Payer: BLUE CROSS/BLUE SHIELD

## 2017-04-05 ENCOUNTER — Encounter: Payer: Self-pay | Admitting: Pulmonary Disease

## 2017-04-05 VITALS — BP 112/76 | HR 77 | Ht 71.0 in | Wt 227.4 lb

## 2017-04-05 DIAGNOSIS — R05 Cough: Secondary | ICD-10-CM

## 2017-04-05 DIAGNOSIS — R053 Chronic cough: Secondary | ICD-10-CM

## 2017-04-05 LAB — COMPREHENSIVE METABOLIC PANEL
ALBUMIN: 4.8 g/dL (ref 3.5–5.2)
ALT: 21 U/L (ref 0–53)
AST: 14 U/L (ref 0–37)
Alkaline Phosphatase: 46 U/L (ref 39–117)
BUN: 9 mg/dL (ref 6–23)
CALCIUM: 9.7 mg/dL (ref 8.4–10.5)
CHLORIDE: 102 meq/L (ref 96–112)
CO2: 27 mEq/L (ref 19–32)
Creatinine, Ser: 1.04 mg/dL (ref 0.40–1.50)
GFR: 84.69 mL/min (ref >60.00)
Glucose, Bld: 107 mg/dL — ABNORMAL HIGH (ref 70–99)
POTASSIUM: 3.8 meq/L (ref 3.5–5.1)
SODIUM: 139 meq/L (ref 135–145)
Total Bilirubin: 0.9 mg/dL (ref 0.2–1.2)
Total Protein: 7.3 g/dL (ref 6.0–8.3)

## 2017-04-05 LAB — CBC WITH DIFFERENTIAL/PLATELET
BASOS PCT: 1.4 % (ref 0.0–3.0)
Basophils Absolute: 0.1 10*3/uL (ref 0.0–0.1)
EOS PCT: 3 % (ref 0.0–5.0)
Eosinophils Absolute: 0.2 10*3/uL (ref 0.0–0.7)
HCT: 42.3 % (ref 39.0–52.0)
HEMOGLOBIN: 14.1 g/dL (ref 13.0–17.0)
Lymphocytes Relative: 40 % (ref 12.0–46.0)
Lymphs Abs: 2.1 10*3/uL (ref 0.7–4.0)
MCHC: 33.3 g/dL (ref 30.0–36.0)
MCV: 81.5 fl (ref 78.0–100.0)
MONO ABS: 0.4 10*3/uL (ref 0.1–1.0)
Monocytes Relative: 7.2 % (ref 3.0–12.0)
Neutro Abs: 2.6 10*3/uL (ref 1.4–7.7)
Neutrophils Relative %: 48.4 % (ref 43.0–77.0)
Platelets: 312 10*3/uL (ref 150.0–400.0)
RBC: 5.19 Mil/uL (ref 4.22–5.81)
RDW: 12.6 % (ref 11.5–15.5)
WBC: 5.3 10*3/uL (ref 4.0–10.5)

## 2017-04-05 LAB — POCT EXHALED NITRIC OXIDE: FeNO level (ppb): 15

## 2017-04-05 MED ORDER — BUDESONIDE-FORMOTEROL FUMARATE 160-4.5 MCG/ACT IN AERO: 2.0000 | INHALATION_SPRAY | Freq: Two times a day (BID) | RESPIRATORY_TRACT | 6 refills | Status: DC

## 2017-04-05 MED ORDER — BUDESONIDE-FORMOTEROL FUMARATE 160-4.5 MCG/ACT IN AERO: 2.0000 | INHALATION_SPRAY | Freq: Two times a day (BID) | RESPIRATORY_TRACT | 0 refills | Status: DC

## 2017-04-05 NOTE — Patient Instructions (Signed)
Symbicort two puffs twice per day, and rinse mouth after each use  Lab test and chest xray today  Will arrange for pulmonary function test  Follow up on October 15 with Dr. Craige Cotta or Nurse Practitioner

## 2017-04-05 NOTE — Progress Notes (Signed)
   Subjective:    Patient ID: Jonathan Jordan, male    DOB: 03-20-1981, 36 y.o.   MRN: [id number]  HPI    Review of Systems  Constitutional: Negative for fever and unexpected weight change.  HENT: Positive for congestion and trouble swallowing. Negative for dental problem, ear pain, nosebleeds, postnasal drip, rhinorrhea, sinus pressure, sneezing and sore throat.   Eyes: Negative for redness and itching.  Respiratory: Positive for cough and shortness of breath. Negative for chest tightness and wheezing.   Cardiovascular: Negative for palpitations and leg swelling.  Gastrointestinal: Positive for nausea and vomiting.  Genitourinary: Negative for dysuria.  Musculoskeletal: Negative for joint swelling.  Skin: Negative for rash.  Allergic/Immunologic: Negative.  Negative for environmental allergies, food allergies and immunocompromised state.  Neurological: Negative for headaches.  Hematological: Does not bruise/bleed easily.  Psychiatric/Behavioral: Negative for dysphoric mood. The patient is not nervous/anxious.        Objective:   Physical Exam        Assessment & Plan:

## 2017-04-05 NOTE — Progress Notes (Signed)
Past surgical history He  has no past surgical history on file.  Family history His family history includes Cancer in his maternal grandmother and paternal grandmother.  Social history He  reports that he has never smoked. He has never used smokeless tobacco. He reports that he does not drink alcohol or use drugs.  Allergies  Allergen Reactions  . Astepro [Astelin]     Abdominal issues  . Bactrim     Abdominal pain   . Ceclor [Cefaclor] Other (See Comments)    Abdominal pain  . Cephalexin Swelling  . Penicillins Swelling   Review of Systems  Constitutional: Negative for fever and unexpected weight change.  HENT: Positive for congestion and trouble swallowing. Negative for dental problem, ear pain, nosebleeds, postnasal drip, rhinorrhea, sinus pressure, sneezing and sore throat.   Eyes: Negative for redness and itching.  Respiratory: Positive for cough and shortness of breath. Negative for chest tightness and wheezing.   Cardiovascular: Negative for palpitations and leg swelling.  Gastrointestinal: Positive for nausea and vomiting.  Genitourinary: Negative for dysuria.  Musculoskeletal: Negative for joint swelling.  Skin: Negative for rash.  Allergic/Immunologic: Negative.  Negative for environmental allergies, food allergies and immunocompromised state.  Neurological: Negative for headaches.  Hematological: Does not bruise/bleed easily.  Psychiatric/Behavioral: Negative for dysphoric mood. The patient is not nervous/anxious.    Current Outpatient Prescriptions on File Prior to Visit  Medication Sig  . azithromycin (ZITHROMAX) 250 MG tablet Use as a zpack  . cetirizine (ZYRTEC) 10 MG tablet Take 1 tablet (10 mg total) by mouth daily.  . clindamycin (CLEOCIN) 300 MG capsule Take 1 capsule (300 mg total) by mouth 4 (four) times daily.  Marland Kitchen ipratropium (ATROVENT) 0.03 % nasal spray Place 2 sprays into the nose 4 (four) times daily.  . metoprolol succinate (TOPROL-XL) 25 MG 24 hr  tablet Take 1 tablet (25 mg total) by mouth daily. PATIENT NEEDS OFFICE VISIT FOR ADDITIONAL REFILLS  . omeprazole (PRILOSEC) 20 MG capsule TAKE 2 CAPSULES BY MOUTH DAILY  . valACYclovir (VALTREX) 1000 MG tablet Take 1 tablet (1,000 mg total) by mouth daily.  . fluticasone (FLONASE) 50 MCG/ACT nasal spray Place 2 sprays into both nostrils daily.  . sertraline (ZOLOFT) 100 MG tablet Take 1 tablet (100 mg total) by mouth daily. PT NEEDS TO ESTABLISH W/PROVIDER IN NEW LOCATION FOR MORE REFILLS.   No current facility-administered medications on file prior to visit.     Chief Complaint  Patient presents with  . Pulm Consult    Pt had asthma as child; had pneumonia four times. The phlegm gets stuck in back of throat, SOB when cough. Pt states worsen over last 6 month, ENT states it not sinus related.    Pulmonary tests FeNO 04/05/17 >> 15  Past medical history He  has a past medical history of Allergy; Asthma; and Hypertension.  Vital signs BP 112/76 (BP Location: Left Arm, Cuff Size: Normal)   Pulse 77   Ht  (1.803 m)   Wt 227 lb 6.4 oz (103.1 kg)   SpO2 97%   BMI 31.72 kg/m   History of present illness Jonathan Jordan is a 36 y.o. male with cough.  He had terrible asthma as a child.  He used to get pneumonia frequently when younger.  His last episode of pneumonia was 7 yrs ago.  He has started getting persistent cough again.  He feels congested in his chest and eventually coughs up thick sputum with white to yellow plugs.  He has noticed feeling more short of breath with activities.  He doesn't wheeze much.  He has been getting an itchy, red circular rash on his left side.  His PCP has been giving him a prescription cream and this helps.  He isn't sure what is causing his rash, and hasn't seen a dermatologist.  He saw an allergist years ago.  He was recently seen by ENT in [place] and was told his sinuses weren't an issue.  He did have sinus xray from 10/14/12 which showed Lt  maxillary sinus opacification.  He never smoked cigarettes.  He is not using any inhalers from his breathing.  He works as a Midwife.  He is from [place].  He denies any recent travel.  He has a Development worker, international aid. Brother had asthma  He has history of reflux and had Nissen fundoplication and repair of hiatal hernia.  This helped his reflux symptoms, but didn't change his cough.   Physical exam  General - No distress ENT - No sinus tenderness, no oral exudate, no LAN, no thyromegaly, TM clear, pupils equal/reactive Cardiac - s1s2 regular, no murmur, pulses symmetric Chest - No wheeze/rales/dullness, good air entry, normal respiratory excursion Back - No focal tenderness Abd - Soft, non-tender, no organomegaly, + bowel sounds Ext - No edema Neuro - Normal strength, cranial nerves intact Skin - No rashes Psych - Normal mood, and behavior   Discussion He has persistent cough.  He has history of laryngopharyngeal reflux and has undergone fundoplication and hiatal hernia repair.  He has been on aggressive therapy for rhinosinusitis.  His cough symptoms persist.  He has history of allergic asthma.  I think his current symptoms are likely related to asthma.  Assessment/plan  Chronic cough. - will get chest xray today  Allergic asthma. - will have him try symbicort - get PFT - check CBC with diff, RAST with IgE  Allergic rhinosinusitis. - continue zyrtec, flonase, nasal irrigation - might need further assessment with allergist  Laryngopharyngeal reflux. - continue prilosec per PCP    Patient Instructions  Symbicort two puffs twice per day, and rinse mouth after each use  Lab test and chest xray today  Will arrange for pulmonary function test  Follow up on October 15 with Dr. Craige Cotta or Nurse Practitioner   Coralyn Helling, MD [facility] Pager:  [phone] 04/05/2017, 3:54 PM

## 2017-04-06 NOTE — Addendum Note (Signed)
Addended by: Cornell Barman on: 04/06/2017 05:21 PM   Modules accepted: Orders

## 2017-04-07 ENCOUNTER — Other Ambulatory Visit: Payer: Self-pay | Admitting: Pulmonary Disease

## 2017-04-07 ENCOUNTER — Telehealth: Payer: Self-pay | Admitting: Pulmonary Disease

## 2017-04-07 NOTE — Telephone Encounter (Signed)
Called spoke with patient today advised that we are in need of two labs completed when he returns back into the office 04-17-17. Patient verbalized understanding and denied any questions and concerns for this matter at this time.   Pt advised that symibort inhaler seems to be working for him since Wednesday appt, but is having nausea concerns from the phlegm stuck in throat making him sick feeling. Routing the VS to seem if pt is in need for something to help with nausea feeling or to better help with the phlegm.

## 2017-04-07 NOTE — Telephone Encounter (Signed)
He can try using mucinex 1200 bid prn to help loosen his phlegm.

## 2017-04-10 ENCOUNTER — Telehealth: Payer: Self-pay | Admitting: Pulmonary Disease

## 2017-04-10 NOTE — Telephone Encounter (Signed)
Dg Chest 2 View  Result Date: 04/06/2017 CLINICAL DATA:  Chest congestion and cough associated with deep inspiration for the past 6 months. Symptoms are worsening. Previous episodes of pneumonia. History of asthma. Nonsmoker. EXAM: CHEST  2 VIEW COMPARISON:  Chest x-ray of May 15, 2010 FINDINGS: The lungs are adequately inflated and clear. The heart and pulmonary vascularity are normal. The mediastinum is normal in width. The trachea is midline. There is no pleural effusion. The bony thorax exhibits no acute abnormality. IMPRESSION: There is no acute cardiopulmonary abnormality. Electronically Signed   By: David  [place]az[place] M.D.   On: 04/06/2017 11:52    CMP Latest Ref Rng & Units 04/05/2017  Glucose 70 - 99 mg/dL 161(W)  BUN 6 - 23 mg/dL 9  Creatinine 9.60 - 4.54 mg/dL 0.98  Sodium 119 - 147 mEq/L 139  Potassium 3.5 - 5.1 mEq/L 3.8  Chloride 96 - 112 mEq/L 102  CO2 19 - 32 mEq/L 27  Calcium 8.4 - 10.5 mg/dL 9.7  Total Protein 6.0 - 8.3 g/dL 7.3  Total Bilirubin 0.2 - 1.2 mg/dL 0.9  Alkaline Phos 39 - 117 U/L 46  AST 0 - 37 U/L 14  ALT 0 - 53 U/L 21    CBC    Component Value Date/Time   WBC 5.3 04/05/2017 1630   RBC 5.19 04/05/2017 1630   HGB 14.1 04/05/2017 1630   HCT 42.3 04/05/2017 1630   PLT 312.0 04/05/2017 1630   MCV 81.5 04/05/2017 1630   MCV 84.2 02/04/2013 1703   MCH 27.3 02/04/2013 1703   MCHC 33.3 04/05/2017 1630   RDW 12.6 04/05/2017 1630   LYMPHSABS 2.1 04/05/2017 1630   MONOABS 0.4 04/05/2017 1630   EOSABS 0.2 04/05/2017 1630   BASOSABS 0.1 04/05/2017 1630     Please inform pt that labs and chest xray are normal.

## 2017-04-11 NOTE — Telephone Encounter (Signed)
Left voice mail on machine for patient to return phone call back regarding results and recommendations.  Will follow up again with patient at later date. 

## 2017-04-11 NOTE — Telephone Encounter (Signed)
Left voice mail on machine for patient to return phone call back regarding lab and CXR results, as well as okay to use Mucinex 1200 bid prn to help loosen with his phlegm per VS. Will follow up again with patient at later date.

## 2017-04-11 NOTE — Telephone Encounter (Signed)
Patient returned phone call regarding results.   Called and spoke with patient today regarding results per vs.  Informed the patient of his results today and recommendations per vs. The patient verbalized understanding and denied any questions or concerns at this time. Nothing further needed.

## 2017-04-14 ENCOUNTER — Telehealth: Payer: Self-pay | Admitting: Pulmonary Disease

## 2017-04-14 DIAGNOSIS — R05 Cough: Secondary | ICD-10-CM

## 2017-04-14 DIAGNOSIS — R053 Chronic cough: Secondary | ICD-10-CM

## 2017-04-14 NOTE — Telephone Encounter (Signed)
PFT order placed

## 2017-04-17 ENCOUNTER — Encounter: Payer: Self-pay | Admitting: Acute Care

## 2017-04-17 ENCOUNTER — Other Ambulatory Visit: Payer: Self-pay | Admitting: Acute Care

## 2017-04-17 ENCOUNTER — Ambulatory Visit (INDEPENDENT_AMBULATORY_CARE_PROVIDER_SITE_OTHER): Payer: BLUE CROSS/BLUE SHIELD | Admitting: Pulmonary Disease

## 2017-04-17 ENCOUNTER — Ambulatory Visit (INDEPENDENT_AMBULATORY_CARE_PROVIDER_SITE_OTHER): Payer: BLUE CROSS/BLUE SHIELD | Admitting: Acute Care

## 2017-04-17 ENCOUNTER — Other Ambulatory Visit: Payer: BLUE CROSS/BLUE SHIELD

## 2017-04-17 DIAGNOSIS — R059 Cough, unspecified: Secondary | ICD-10-CM

## 2017-04-17 DIAGNOSIS — R05 Cough: Secondary | ICD-10-CM

## 2017-04-17 DIAGNOSIS — J45909 Unspecified asthma, uncomplicated: Secondary | ICD-10-CM | POA: Diagnosis not present

## 2017-04-17 DIAGNOSIS — R053 Chronic cough: Secondary | ICD-10-CM

## 2017-04-17 DIAGNOSIS — J309 Allergic rhinitis, unspecified: Secondary | ICD-10-CM

## 2017-04-17 DIAGNOSIS — K219 Gastro-esophageal reflux disease without esophagitis: Secondary | ICD-10-CM

## 2017-04-17 LAB — PULMONARY FUNCTION TEST
DL/VA % pred: 97 %
DL/VA: 4.62 ml/min/mmHg/L
DLCO UNC: 25.94 ml/min/mmHg
DLCO cor % pred: 75 %
DLCO cor: 25.32 ml/min/mmHg
DLCO unc % pred: 77 %
FEF 25-75 Post: 5.12 L/sec
FEF 25-75 Pre: 4.17 L/sec
FEF2575-%Change-Post: 22 %
FEF2575-%Pred-Post: 123 %
FEF2575-%Pred-Pre: 100 %
FEV1-%CHANGE-POST: 5 %
FEV1-%Pred-Post: 83 %
FEV1-%Pred-Pre: 79 %
FEV1-POST: 3.66 L
FEV1-PRE: 3.46 L
FEV1FVC-%Change-Post: 1 %
FEV1FVC-%Pred-Pre: 106 %
FEV6-%Change-Post: 4 %
FEV6-%PRED-POST: 79 %
FEV6-%PRED-PRE: 75 %
FEV6-PRE: 4.06 L
FEV6-Post: 4.26 L
FEV6FVC-%CHANGE-POST: 0 %
FEV6FVC-%PRED-POST: 102 %
FEV6FVC-%Pred-Pre: 101 %
FVC-%CHANGE-POST: 4 %
FVC-%PRED-POST: 77 %
FVC-%PRED-PRE: 74 %
FVC-POST: 4.26 L
FVC-PRE: 4.07 L
POST FEV6/FVC RATIO: 100 %
PRE FEV6/FVC RATIO: 100 %
Post FEV1/FVC ratio: 86 %
Pre FEV1/FVC ratio: 85 %
RV % pred: 104 %
RV: 1.93 L
TLC % PRED: 85 %
TLC: 6.12 L

## 2017-04-17 MED ORDER — MONTELUKAST SODIUM 10 MG PO TABS: 10.0000 mg | ORAL_TABLET | Freq: Every day | ORAL | 11 refills | Status: DC

## 2017-04-17 MED ORDER — PREDNISONE 10 MG PO TABS: ORAL_TABLET | ORAL | 0 refills | Status: DC

## 2017-04-17 NOTE — Assessment & Plan Note (Signed)
We will call you with your lab results. Prednisone taper; 10 mg tablets: 4 tabs x 2 days, 3 tabs x 2 days, 2 tabs x 2 days 1 tab x 2 days then stop. Do not take until after you have had your skin testing. Ok to decrease Symbicort to 2 puffs once daily If you notice increase in symptoms, increase back to 2 puffs twice daily. Rinse mouth after use Sips of water instead of throat clearing. Sugar free jolly ranchers for throat soothing Sputum culture ( Provide specimen  cup) Singulair 10 mg daily Do not take until after you have had your skin testing. Stop taking Zyrtec Follow up in 2 weeks to see if this treatment plan has helped. Please contact office for sooner follow up if symptoms do not improve or worsen or seek emergency care.  Consider CT Chest/ nebulized mucolytic if the above does not improve symptoms.

## 2017-04-17 NOTE — Progress Notes (Signed)
I have reviewed and agree with assessment/plan.  Darling Cieslewicz, MD [facility] 04/17/2017, 6:04 PM Pager:  [phone]  

## 2017-04-17 NOTE — Progress Notes (Signed)
PFT completed today 04/17/17.  

## 2017-04-17 NOTE — Assessment & Plan Note (Signed)
Start Singulair 10 mg once daily. Do not start until after your allergy testing 04/18/2017 Follow up in 2 weeks or before as needed.

## 2017-04-17 NOTE — Progress Notes (Signed)
History of Present Illness Jonathan Jordan is a 36 y.o. male never smoker with cough. He is followed by Dr. Craige Cotta.  Synopsis: Consultation 04/2017: Pt. Has a history of asthma as a child. He states he used to get pneumonia frequently.Last episode was 7 years ago.He has started getting a persistent cough again, with chest congestion, thick sputum with white to yellow plugs 09/2016.He had a sinus surgery in February.He has had worsening dyspnea with activity. He has also developed a rash, red circular on his left side which has responded to treatment with a prescription cream.He was seen recently by allergist, and ENT in [facility].He ws told sinuses were not an issue.He did have sinus xray from 10/14/12 which showed Lt maxillary sinus opacification. He works as a Midwife. He uses no inhalers for his breathing. He has a dog. He has history of reflux and had Nissen fundoplication and repair of hiatal hernia.  This helped his reflux symptoms, but didn't change his cough.He denies any PND.   04/17/2017 Follow Up: Pt presents for follow up. He was seen by Dr. Craige Cotta 04/05/2017 . The plan of care at that time was :  Chronic cough. - will get chest xray today  Allergic asthma. - will have him try symbicort - get PFT - check CBC with diff, RAST with IgE  Allergic rhinosinusitis. - continue zyrtec, flonase, nasal irrigation - might need further assessment with allergist  Laryngopharyngeal reflux. - continue prilosec per PCP    He presents today for follow up. He did not have RAST or IgE but these were done today. He is going to see allergist for skin testing tomorrow 04/18/2017. He states he has been using his Mucinex 1200 mg twice daily and the Symbicort 2 puffs twice daily. He states they are making him very anxious and on edge and queezy. He states he feels the Mucinex may be helping a little bit, but not a lot. He states the thick secretions are still there and the secretions are bottle  necking in his throat.He states he does not notice any change with the Mucinex or Symbicort ( no change in dyspnea ). He has been seen by an ENT. There were no issues identified.He states symptoms are  better after eating and in the morning after he wakes up. He denies fever, chest pain, orthopnea or hemoptysis.    Test Results:  FeNO 04/05/17 >> 15 04/17/2017>> PFT's No airway obstruction with mild restriction Slightly diminished DLCO  ( ? Asthma)  DG Chest 04/10/2017: IMPRESSION: There is no acute cardiopulmonary abnormality.   CBC Latest Ref Rng & Units 04/05/2017 02/04/2013  WBC 4.0 - 10.5 K/uL 5.3 7.5  Hemoglobin 13.0 - 17.0 g/dL 16.1 09.6  Hematocrit 04.5 - 52.0 % 42.3 44.0  Platelets 150.0 - 400.0 K/uL 312.0 -    BMP Latest Ref Rng & Units 04/05/2017  Glucose 70 - 99 mg/dL 409(W)  BUN 6 - 23 mg/dL 9  Creatinine 1.19 - 1.47 mg/dL 8.29  Sodium 562 - 130 mEq/L 139  Potassium 3.5 - 5.1 mEq/L 3.8  Chloride 96 - 112 mEq/L 102  CO2 19 - 32 mEq/L 27  Calcium 8.4 - 10.5 mg/dL 9.7     PFT    Component Value Date/Time   FEV1PRE 3.46 04/17/2017 1036   FEV1POST 3.66 04/17/2017 1036   FVCPRE 4.07 04/17/2017 1036   FVCPOST 4.26 04/17/2017 1036   TLC 6.12 04/17/2017 1036   DLCOUNC 25.94 04/17/2017 1036   PREFEV1FVCRT  85 04/17/2017 1036   PSTFEV1FVCRT 86 04/17/2017 1036    Dg Chest 2 View  Result Date: 04/06/2017 CLINICAL DATA:  Chest congestion and cough associated with deep inspiration for the past 6 months. Symptoms are worsening. Previous episodes of pneumonia. History of asthma. Nonsmoker. EXAM: CHEST  2 VIEW COMPARISON:  Chest x-ray of May 15, 2010 FINDINGS: The lungs are adequately inflated and clear. The heart and pulmonary vascularity are normal. The mediastinum is normal in width. The trachea is midline. There is no pleural effusion. The bony thorax exhibits no acute abnormality. IMPRESSION: There is no acute cardiopulmonary abnormality. Electronically Signed   By:  David  [place] M.D.   On: 04/06/2017 11:52     Past medical hx Past Medical History:  Diagnosis Date  . Allergy   . Asthma   . Hypertension      Social History  Substance Use Topics  . Smoking status: Never Smoker  . Smokeless tobacco: Never Used  . Alcohol use No     Comment: socially    [name] reports that he has never smoked. He has never used smokeless tobacco. He reports that he does not drink alcohol or use drugs.  Tobacco Cessation: Never smoker  Past surgical hx, Family hx, Social hx all reviewed.  Current Outpatient Prescriptions on File Prior to Visit  Medication Sig  . budesonide-formoterol (SYMBICORT) 160-4.5 MCG/ACT inhaler Inhale 2 puffs into the lungs 2 (two) times daily.  . cetirizine (ZYRTEC) 10 MG tablet Take 1 tablet (10 mg total) by mouth daily.  Marland Kitchen ipratropium (ATROVENT) 0.03 % nasal spray Place 2 sprays into the nose 4 (four) times daily.  . valACYclovir (VALTREX) 1000 MG tablet Take 1 tablet (1,000 mg total) by mouth daily.  . sertraline (ZOLOFT) 100 MG tablet Take 1 tablet (100 mg total) by mouth daily. PT NEEDS TO ESTABLISH W/PROVIDER IN NEW LOCATION FOR MORE REFILLS.   No current facility-administered medications on file prior to visit.      Allergies  Allergen Reactions  . Astepro [Astelin]     Abdominal issues  . Bactrim     Abdominal pain   . Ceclor [Cefaclor] Other (See Comments)    Abdominal pain  . Cephalexin Swelling  . Penicillins Swelling    Review Of Systems:  Constitutional:   No  weight loss, night sweats,  Fevers, chills, fatigue, or  lassitude.  HEENT:   No headaches,  Difficulty swallowing,  Tooth/dental problems, or  Sore throat,                No sneezing, itching, ear ache, nasal congestion, post nasal drip, sensation of secretions  hung in throat.  CV:  No chest pain,  Orthopnea, PND, swelling in lower extremities, anasarca, dizziness, palpitations, syncope.   GI  No heartburn, indigestion, abdominal pain,  nausea, vomiting, diarrhea, change in bowel habits, loss of appetite, bloody stools.   Resp: + shortness of breath with exertion none at rest.  + excess mucus, no productive cough,  + non-productive cough,  No coughing up of blood.  No change in color of mucus.  No wheezing.  No chest wall deformity  Skin: + rash , no  lesions.  GU: no dysuria, change in color of urine, no urgency or frequency.  No flank pain, no hematuria   MS:  No joint pain or swelling.  No decreased range of motion.  No back pain.  Psych:  No change in mood or affect. No depression or anxiety.  No  memory loss.   Vital Signs BP 126/88 (BP Location: Left Arm, Cuff Size: Normal)   Pulse 63   Ht  (1.803 m)   Wt 228 lb (103.4 kg)   SpO2 100%   BMI 31.80 kg/m    Physical Exam:  General- No distress,  A&Ox3, pleasant ENT: No sinus tenderness, TM clear, pale nasal mucosa, no oral exudate,no post nasal drip, no LAN Cardiac: S1, S2, regular rate and rhythm, no murmur Chest: No wheeze/ rales/ dullness; no accessory muscle use, no nasal flaring, no sternal retractions Abd.: Soft Non-tender, non-distended Ext: No clubbing cyanosis, edema Neuro:  normal strength Skin: No rashes, warm and dry Psych: normal mood and behavior   Assessment/Plan  Cough We will call you with your lab results. Prednisone taper; 10 mg tablets: 4 tabs x 2 days, 3 tabs x 2 days, 2 tabs x 2 days 1 tab x 2 days then stop. Do not take until after you have had your skin testing. Prescription for Mucinex liquid for chest congestion. Sips of water instead of throat clearing. Sugar free jolly ranchers for throat soothing Sputum culture ( Provide specimen  cup) Singulair 10 mg daily Do not take until after you have had your skin testing. Follow up in 2 weeks to see if this treatment plan has helped. Please contact office for sooner follow up if symptoms do not improve or worsen or seek emergency care.  Consider CT Chest/ nebulized  mucolytic if the above does not improve symptoms.    Allergic asthma We will call you with your lab results. Prednisone taper; 10 mg tablets: 4 tabs x 2 days, 3 tabs x 2 days, 2 tabs x 2 days 1 tab x 2 days then stop. Do not take until after you have had your skin testing. Ok to decrease Symbicort to 2 puffs once daily If you notice increase in symptoms, increase back to 2 puffs twice daily. Rinse mouth after use Sips of water instead of throat clearing. Sugar free jolly ranchers for throat soothing Sputum culture ( Provide specimen  cup) Singulair 10 mg daily Do not take until after you have had your skin testing. Stop taking Zyrtec Follow up in 2 weeks to see if this treatment plan has helped. Please contact office for sooner follow up if symptoms do not improve or worsen or seek emergency care.  Consider CT Chest/ nebulized mucolytic if the above does not improve symptoms.    Allergic rhinosinusitis Start Singulair 10 mg once daily. Do not start until after your allergy testing 04/18/2017 Follow up in 2 weeks or before as needed.  GERD (gastroesophageal reflux disease) Recent Nissen fundoplication and repair of hiatal hernia. Plan: Follow up and PPI per GI    Bevelyn Ngo, NP 04/17/2017  1:34 PM

## 2017-04-17 NOTE — Assessment & Plan Note (Signed)
We will call you with your lab results. Prednisone taper; 10 mg tablets: 4 tabs x 2 days, 3 tabs x 2 days, 2 tabs x 2 days 1 tab x 2 days then stop. Do not take until after you have had your skin testing. Prescription for Mucinex liquid for chest congestion. Sips of water instead of throat clearing. Sugar free jolly ranchers for throat soothing Sputum culture ( Provide specimen  cup) Singulair 10 mg daily Do not take until after you have had your skin testing. Follow up in 2 weeks to see if this treatment plan has helped. Please contact office for sooner follow up if symptoms do not improve or worsen or seek emergency care.  Consider CT Chest/ nebulized mucolytic if the above does not improve symptoms.

## 2017-04-17 NOTE — Patient Instructions (Addendum)
It is nice to meet you today. We will call you with your lab results. Prednisone taper; 10 mg tablets: 4 tabs x 2 days, 3 tabs x 2 days, 2 tabs x 2 days 1 tab x 2 days then stop. Do not take until after you have had your skin testing. Prescription for Mucinex liquid for chest congestion. Ok to decrease Symbicort to 2 puffs once daily If you notice increase in symptoms, increase back to 2 puffs twice daily. Rinse mouth after use Continue Muxinex 1200 mg twice daily Sips of water instead of throat clearing. Sugar free jolly ranchers for throat soothing Sputum culture ( Provide specimen  cup) Singulair 10 mg daily Do not take until after you have had your skin testing. Stop taking Zyrtec Follow up in 2 weeks to see if this treatment plan has helped. Please contact office for sooner follow up if symptoms do not improve or worsen or seek emergency care.  Consider CT Chest/ nebulized mucolytic if the above does not improve symptoms.

## 2017-04-17 NOTE — Assessment & Plan Note (Signed)
Recent Nissen fundoplication and repair of hiatal hernia. Plan: Follow up and PPI per GI

## 2017-04-19 LAB — RESPIRATORY ALLERGY PANEL REGION II W/ RFLX: ~~LOC~~
Allergen, A. alternata, m6: 1.76 kU/L — ABNORMAL HIGH
Allergen, Cedar tree, t12: <0.1 kU/L
Allergen, Comm Silver Birch, t9: 1.57 kU/L — ABNORMAL HIGH
Allergen, Cottonwood, t14: <0.1 kU/L
Allergen, D pternoyssinus,d7: <0.1 kU/L
Allergen, Mouse Urine Protein, e78: <0.1 kU/L
Allergen, Mulberry, t76: <0.1 kU/L
Allergen, Oak,t7: 0.85 kU/L — ABNORMAL HIGH
Allergen, P. notatum, m1: <0.1 kU/L
Aspergillus fumigatus, m3: 0.24 kU/L — ABNORMAL HIGH
Bermuda Grass: 0.25 kU/L — ABNORMAL HIGH
Box Elder IgE: <0.1 kU/L
CLADOSPORIUM HERBARUM (M2) IGE: <0.1 kU/L
COMMON RAGWEED (SHORT) (W1) IGE: 0.27 kU/L — ABNORMAL HIGH
Cat Dander: <0.1 kU/L
Class: 0
Class: 0
Class: 0
Class: 0
Class: 0
Class: 0
Class: 0
Class: 0
Class: 0
Class: 0
Class: 0
Class: 0
Class: 0
Class: 0
Class: 0
Class: 0
Class: 0
Class: 0
Class: 0
Class: 0
Class: 2
Class: 2
Class: 2
Class: 3
Cockroach: <0.1 kU/L
D. farinae: <0.1 kU/L
Dog Dander: 0.19 kU/L — ABNORMAL HIGH
Elm IgE: <0.1 kU/L
IgE (Immunoglobulin E), Serum: 26 kU/L
Johnson Grass: 0.31 kU/L — ABNORMAL HIGH
Pecan/Hickory Tree IgE: <0.1 kU/L
Rough Pigweed  IgE: 0.11 kU/L — ABNORMAL HIGH
Sheep Sorrel IgE: <0.1 kU/L
Timothy Grass: 3.66 kU/L — ABNORMAL HIGH

## 2017-04-19 LAB — INTERPRETATION:

## 2017-05-12 ENCOUNTER — Ambulatory Visit (INDEPENDENT_AMBULATORY_CARE_PROVIDER_SITE_OTHER): Payer: BLUE CROSS/BLUE SHIELD | Admitting: Pulmonary Disease

## 2017-05-12 ENCOUNTER — Encounter: Payer: Self-pay | Admitting: Pulmonary Disease

## 2017-05-12 VITALS — BP 126/78 | HR 94 | Ht 71.0 in | Wt 229.2 lb

## 2017-05-12 DIAGNOSIS — J4550 Severe persistent asthma, uncomplicated: Secondary | ICD-10-CM

## 2017-05-12 DIAGNOSIS — J301 Allergic rhinitis due to pollen: Secondary | ICD-10-CM

## 2017-05-12 NOTE — Patient Instructions (Signed)
Will start process to get approval for xolair  Follow up in 6 weeks with Dr. Craige CottaSood or Nurse Practitioner

## 2017-05-12 NOTE — Progress Notes (Signed)
Current Outpatient Medications on File Prior to Visit  Medication Sig  . amLODipine (NORVASC) 5 MG tablet Take 5 mg by mouth daily.  . budesonide-formoterol (SYMBICORT) 160-4.5 MCG/ACT inhaler Inhale 2 puffs into the lungs 2 (two) times daily.  . busPIRone (BUSPAR) 15 MG tablet   . cetirizine (ZYRTEC) 10 MG tablet Take 1 tablet (10 mg total) by mouth daily.  Marland Kitchen. dicyclomine (BENTYL) 20 MG tablet   . fluticasone (FLONASE) 50 MCG/ACT nasal spray 2 sprays by Both Nostrils route at bedtime.  . GUAIFENESIN 1200 PO Take 1,200 mg 2 (two) times daily by mouth.  . montelukast (SINGULAIR) 10 MG tablet Take 1 tablet (10 mg total) by mouth at bedtime.  . Multiple Vitamins-Minerals (MENS MULTIVITAMIN PLUS PO) Take 1 tablet daily by mouth.  Marland Kitchen. olopatadine (PATANOL) 0.1 % ophthalmic solution Place 1 drop as needed into both eyes for allergies.  Marland Kitchen. ondansetron (ZOFRAN-ODT) 4 MG disintegrating tablet   . sertraline (ZOLOFT) 25 MG tablet Take 25 mg daily by mouth.  . valACYclovir (VALTREX) 1000 MG tablet Take 1 tablet (1,000 mg total) by mouth daily.  . sertraline (ZOLOFT) 100 MG tablet Take 1 tablet (100 mg total) by mouth daily. PT NEEDS TO ESTABLISH W/PROVIDER IN NEW LOCATION FOR MORE REFILLS.   No current facility-administered medications on file prior to visit.      Chief Complaint  Patient presents with  . Follow-up    Pt states that he still has a lot of crud in his chest. Has been having a lot of sinus drainage but states that he is going to be seeing an ENT. Pt feels real hoarse in the mornings and in the evenings. Denies any SOB or CP.     Pulmonary tests FeNO 04/05/17 >> 15 RAST 04/17/17 >> dust mites, dog dander, trees, grasses; IgE 26 PFT 04/17/17 >> FEV1 3.66 (83%), FEV1% 86, TLC 6.12 (85%), DLCO 77%  Past medical history Hypertension  Past surgical history, Family history, Social history, Allergies all reviewed.  Vital Signs BP 126/78 (BP Location: Right Arm, Cuff Size: Normal)    Pulse 94   Ht 5\' 11"  (1.803 m)   Wt 229 lb 3.2 oz (104 kg)   SpO2 98%   BMI 31.97 kg/m   History of Present Illness Jonathan Jordan is a 36 y.o. male with allergic asthma and rhinitis.  He continues to have sinus congestion, post nasal drip, and cough.  He chest feels tight if he tries to take a deep breath.  He brings up clear sputum.  He felt some improvement with prednisone.  He uses symbicort, singulair, zyrtec, flonase, and patanol.  These don't control his symptoms.  His recent lab tests showed positive RAST.  Chest xray from 04/06/17 was normal.  He was seen by ENT recently.    Physical Exam  General - pleasant Eyes - pupils reactive ENT - no sinus tenderness, clear nasal drainage, wears dentures, no oral exudate, no LAN Cardiac - regular, no murmur Chest - no wheeze, rales Abd - soft, non tender Ext - no edema Skin - no rashes Neuro - normal strength Psych - normal mood    Assessment/Plan  Allergic asthma and allergic rhinsinusitis. - he has been on several control medications w/o complete response - he has a positive RAST - I think he would benefit from additional of biologic agent >> will start process to get approval for xolair - he is to continue zyrtec, symbicort, singulair, and flonase  Laryngopharyngeal reflux. - prilosec  per PCP   Patient Instructions  Will start process to get approval for xolair  Follow up in 6 weeks with Dr. Craige CottaSood or Nurse Practitioner    Coralyn HellingVineet Berenize Gatlin, MD [facility] Pager:  [phone] 05/12/2017, 12:35 PM

## 2017-05-16 ENCOUNTER — Telehealth: Payer: Self-pay | Admitting: Pulmonary Disease

## 2017-05-16 NOTE — Telephone Encounter (Signed)
Lets set him up for fasenra please.

## 2017-05-16 NOTE — Telephone Encounter (Signed)
I was filling out Jonathan Jordan's xolair papers and noticed his IgE was 26. It should be 30 or greater. I called [employer] and asked them what to do, the rep. Said if we were planning to use the [employer] (free med.), we could submit his SMN. He doesn't qualify, pt. Has [employer] and has met his deductible for their fiscal yr.Marland Kitchen His ins. Is paying a 100 %.  I looked at his CBC w/ Diff. And he does qualify for nucala, fasenra, or dupixent. Please advise.

## 2017-05-17 NOTE — Telephone Encounter (Signed)
Yes, sir. I called the pt. To make him aware we are now trying to get fasenra approved for him. Orginaly it was Geologist, engineeringxolair, his IgE wasn't high enough.  I'm going to fill out the paperwork and see if we can e-mail it to him, since he[place] in [place]Marland Kitchen

## 2017-05-23 ENCOUNTER — Telehealth: Payer: Self-pay | Admitting: Pulmonary Disease

## 2017-05-24 NOTE — Telephone Encounter (Signed)
Error

## 2017-05-31 NOTE — Telephone Encounter (Addendum)
Pt. Was able to fill out his portion of the enrollment form online (05/18/17) The summary of benefits came in. (05/24/17)  I intiated the p/a. Today due to the holidays and ordering all pts. Meds. For the month. I asked [employer] if they could expedite this because the pt. [place] of [place] and needs to start another med. here in [place]. (turn around time 24 hrs,,) Pt. Wants to start on the meds. On the same day. (to cut down on drive time.)

## 2017-06-01 NOTE — Telephone Encounter (Signed)
[employer] [place] To check on Pt.'s p/a, it was denied. I called the pt. To make him aware. I explained there is still hope. If they deny his fasenra again pt. Will be able to get it free. I'm waiting on the denial so I can ask Dr. Craige CottaSood what he wants to do. I should get the denial letter tomorrow.

## 2017-06-06 NOTE — Telephone Encounter (Signed)
I never got the denial letter, so I called [facility] back and the rep. Said she needed to fill out a form for a referral. She filled it out and submitted it, the process will take up to 7 business days. She said he had been approved. Waiting for fax or call from [facility]. I called the pt. To make him aware. ( Will keep pt. Informed.)

## 2017-06-16 NOTE — Telephone Encounter (Signed)
Pt asking for tammy Isa to return his call, cb is [phone]

## 2017-06-16 NOTE — Telephone Encounter (Signed)
I called the pt. Back to let him know he will be able to get his shot Wed.. I will schedule him an appt, he ask for wed. At 10:30. I explained to the pt. I was unsuccessful in calling the member services, they wouldn't except the pt's ins. ID#. We will keep trying. Sample per Katie. Leaving a note for Florentina AddisonKatie to ask the girls up front to make him an appt. I kept getting hosp. F/u.

## 2017-06-21 ENCOUNTER — Ambulatory Visit (INDEPENDENT_AMBULATORY_CARE_PROVIDER_SITE_OTHER): Payer: BLUE CROSS/BLUE SHIELD | Admitting: Pulmonary Disease

## 2017-06-21 ENCOUNTER — Telehealth: Payer: Self-pay | Admitting: Pulmonary Disease

## 2017-06-21 ENCOUNTER — Encounter: Payer: Self-pay | Admitting: Pulmonary Disease

## 2017-06-21 ENCOUNTER — Ambulatory Visit (INDEPENDENT_AMBULATORY_CARE_PROVIDER_SITE_OTHER): Payer: BLUE CROSS/BLUE SHIELD

## 2017-06-21 ENCOUNTER — Inpatient Hospital Stay: Payer: BLUE CROSS/BLUE SHIELD

## 2017-06-21 VITALS — BP 120/80 | HR 55 | Ht 71.0 in | Wt 226.8 lb

## 2017-06-21 DIAGNOSIS — J45909 Unspecified asthma, uncomplicated: Secondary | ICD-10-CM

## 2017-06-21 DIAGNOSIS — J4550 Severe persistent asthma, uncomplicated: Secondary | ICD-10-CM

## 2017-06-21 DIAGNOSIS — J309 Allergic rhinitis, unspecified: Secondary | ICD-10-CM

## 2017-06-21 NOTE — Progress Notes (Signed)
[facility]  Chief Complaint  Patient presents with  . Follow-up    Pt is doing better since last visit.    Vital signs: BP 120/80 (BP Location: Left Arm, Cuff Size: Normal)   Pulse (!) 55   Ht 5\' 11"  (1.803 m)   Wt 226 lb 12.8 oz (102.9 kg)   SpO2 99%   BMI 31.63 kg/m   History of Present Illness: Jonathan SimmersScott B Jordan is a 36 y.o. male with allergic asthma, and allergic rhino-sinusitis.  He has been doing well.  Not having cough, wheeze, or sputum.  Sinuses okay.  He sees Dr. Marisa SprinklesPhipps with ENT, and started on allergy drops sublingual.  This costs $180 ever 90 days, and not covered by insurance.  Had xolair shot this morning.  Stopped singulair >> was causing mouth dryness.   Physical Exam:  General - pleasant Eyes - pupils reactive ENT - no sinus tenderness, no oral exudate, no LAN Cardiac - regular, no murmur Chest - no wheeze, rales Abd - soft, non tender Ext - no edema Skin - no rashes Neuro - normal strength Psych - normal mood   Assessment/Plan:  Allergic asthma and allergic rhinsinusitis. - continue symbicort, zyrtec, flonase - started xolair today - will need to determine if he needs to continue allergy drops sublingual once he is on xolair for longer period of time   Patient Instructions  Follow up in 6 months    Coralyn HellingVineet Roseanna Koplin, MD [facility] 06/21/2017, 11:40 AM Pager:  [phone]  Flow Sheet  Pulmonary tests: FeNO 04/05/17 >> 15 RAST 04/17/17 >> dust mites, dog dander, trees, grasses; IgE 26 PFT 04/17/17 >> FEV1 3.66 (83%), FEV1% 86, TLC 6.12 (85%), DLCO 77%  Sleep tests:  Cardiac tests:  Events: 06/20/18 Start xolair  Past Medical History: He  has a past medical history of Allergy, Asthma, and Hypertension.  Past Surgical History: He  has no past surgical history on file.  Family History: His family history includes Cancer in his maternal grandmother and paternal  grandmother.  Social History: He  reports that  has never smoked. he has never used smokeless tobacco. He reports that he does not drink alcohol or use drugs.  Medications: Allergies as of 06/21/2017      Reactions   Astepro [astelin]    Abdominal issues   Bactrim    Abdominal pain   Ceclor [cefaclor] Other (See Comments)   Abdominal pain   Cephalexin Swelling   Penicillins Swelling   Clindamycin/lincomycin Rash      Medication List        Accurate as of 06/21/17 11:40 AM. Always use your most recent med list.          amLODipine 5 MG tablet Commonly known as:  NORVASC Take 5 mg by mouth daily.   budesonide-formoterol 160-4.5 MCG/ACT inhaler Commonly known as:  SYMBICORT Inhale 2 puffs into the lungs 2 (two) times daily.   busPIRone 15 MG tablet Commonly known as:  BUSPAR   cetirizine 10 MG tablet Commonly known as:  ZYRTEC Take 1 tablet (10 mg total) by mouth daily.   dicyclomine 20 MG tablet Commonly known as:  BENTYL   fluticasone 50 MCG/ACT nasal spray Commonly known as:  FLONASE 2 sprays by Both Nostrils route at bedtime.   GUAIFENESIN 1200 PO Take 1,200 mg 2 (two) times daily by mouth.   ondansetron 4 MG disintegrating tablet Commonly known as:  ZOFRAN-ODT   sertraline 25 MG  tablet Commonly known as:  ZOLOFT Take 25 mg daily by mouth.   valACYclovir 1000 MG tablet Commonly known as:  VALTREX Take 1 tablet (1,000 mg total) by mouth daily.

## 2017-06-21 NOTE — Telephone Encounter (Signed)
Jonathan Jordan. From [employer] calling back regarding a denial Fasenra. Provider may submit reconsideration. Fax number [phone] may take up to 48hrs to receive fax. Call [phone] with any questions.

## 2017-06-21 NOTE — Patient Instructions (Signed)
Follow up in 6 months 

## 2017-06-23 NOTE — Telephone Encounter (Signed)
Yes, I called and gave a verbal p/a. Ref.#: [id number] (06/21/17) Sorry forgot to document.

## 2017-06-23 NOTE — Telephone Encounter (Signed)
TS did you get this?

## 2017-06-28 MED ORDER — BENRALIZUMAB 30 MG/ML ~~LOC~~ SOSY
30.0000 mg | PREFILLED_SYRINGE | Freq: Once | SUBCUTANEOUS | Status: AC
  Administered 2017-06-21: 30 mg via SUBCUTANEOUS

## 2017-06-30 NOTE — Telephone Encounter (Signed)
Called today to check the status of p/a

## 2017-07-03 ENCOUNTER — Telehealth: Payer: Self-pay | Admitting: Pulmonary Disease

## 2017-07-03 NOTE — Telephone Encounter (Signed)
[facility] on 06/30/17 regarding medication Xolair 150mg  syringe has been approved for coverage. This approvcal is effective from 06/30/17 until 06/30/2018. This medication is considered medically necessary and it not excluded under his plan at this time.

## 2017-07-05 NOTE — Telephone Encounter (Signed)
[name] please advise °

## 2017-07-11 ENCOUNTER — Encounter: Payer: Self-pay | Admitting: Pharmacy Technician

## 2017-07-11 ENCOUNTER — Telehealth: Payer: Self-pay | Admitting: Pulmonary Disease

## 2017-07-11 NOTE — Telephone Encounter (Signed)
Returned Ashlandiki's call from [employer], she called to tell me what I already knew. P/A was denied again. I sent denial paperwork to [facility], nothing further needed. Closing.

## 2017-07-11 NOTE — Telephone Encounter (Signed)
I called 06/21/17 to start 2nd appeal. Someone sent what would be the 2nd denial to [place].( [facility] 06/30/17, I can't remember when Katie hand delivered it to me.)  Filled out denial form and sent it 07/06/17.  Hadn't heard from [facility] so I called [facility] [employer] today and they gave me [facility] #. They gave me their fax #, I sent pt's enrollment form, as it has the rx on it they need.(07/11/17) [facility] should call the pt. And get his info. And permission to ship. They should be calling us to set up del.. Will wait til 07/13/17, then I'll call back if I haven't heard from [facility].

## 2017-07-11 NOTE — Telephone Encounter (Signed)
Routing to [employer]ammy Camarion per office protocol.

## 2017-07-14 NOTE — Telephone Encounter (Signed)
[name], is anything else needed for this patient? Please advise. Thanks!

## 2017-07-17 ENCOUNTER — Telehealth: Payer: Self-pay | Admitting: Pulmonary Disease

## 2017-07-17 ENCOUNTER — Telehealth: Payer: Self-pay | Admitting: *Deleted

## 2017-07-17 NOTE — Telephone Encounter (Signed)
Please refer to 05/16/18 ph. Note. (too many ph notes) Nothing further needed.

## 2017-07-17 NOTE — Telephone Encounter (Signed)
I called [facility] to set up shipment, today. Harrington ChallengerFasenra will be del. 07/19/17. I will call pt. To make him aware and put order in another ph. Note. Nothing further needed.

## 2017-07-17 NOTE — Telephone Encounter (Signed)
This is in a another ph. That was created 07/17/17, Harrington ChallengerFasenra is coming in 07/19/16. Pt. Appt. Is 07/21/17, made pt. Aware. Nothing further needed.

## 2017-07-17 NOTE — Telephone Encounter (Signed)
Fasenra PFS: 1  Order Date:07/17/17 Ship Date:07/18/17

## 2017-07-19 ENCOUNTER — Telehealth: Payer: Self-pay | Admitting: Pulmonary Disease

## 2017-07-19 NOTE — Telephone Encounter (Signed)
Ok to leave message regarding possible apt if pt does not answer.

## 2017-07-19 NOTE — Telephone Encounter (Signed)
fasenra PFS: 1 Arrival Date: 07/19/2017 Lot#:[id number] Exp.: 09/2018

## 2017-07-19 NOTE — Telephone Encounter (Signed)
Spoke with pt, c/o increased sinus congestion, PND with yellow mucus, sore throat Has a Fasenra appt on Friday at 10:30, requesting an OV on Friday morning.  Ov scheduled with MW at pt request.  Nothing further needed.

## 2017-07-19 NOTE — Telephone Encounter (Signed)
Pt having drainage,cough and would like to be seen when he comes on Friday for his INJ. Per Pt he has a 2hr drive and would like to get in if possible. No avail. W/Sood or TP. Pt does not want to be sched w/SG. Cb is [phone].

## 2017-07-19 NOTE — Telephone Encounter (Signed)
I thought pt.'s call was Fasenra related. He's having drainage, if it's not better by Fri. Can he see someone? Transferred pt. To front to ask scheduler.

## 2017-07-20 ENCOUNTER — Telehealth: Payer: Self-pay | Admitting: *Deleted

## 2017-07-20 ENCOUNTER — Telehealth: Payer: Self-pay | Admitting: Pulmonary Disease

## 2017-07-20 NOTE — Telephone Encounter (Signed)
I returned rep. Matthew's call, to let [facility] know pt's fasenra had been denied twice.  [facility] Challenger[facility]as[facility] has a denied program, pt. Is enrolled in it. Nothing further needed.

## 2017-07-20 NOTE — Telephone Encounter (Signed)
Routing this to [employer]ammy Klever for her to follow.

## 2017-07-21 ENCOUNTER — Ambulatory Visit (INDEPENDENT_AMBULATORY_CARE_PROVIDER_SITE_OTHER): Payer: BLUE CROSS/BLUE SHIELD | Admitting: Internal Medicine

## 2017-07-21 ENCOUNTER — Encounter: Payer: Self-pay | Admitting: Internal Medicine

## 2017-07-21 ENCOUNTER — Ambulatory Visit (INDEPENDENT_AMBULATORY_CARE_PROVIDER_SITE_OTHER): Payer: BLUE CROSS/BLUE SHIELD

## 2017-07-21 DIAGNOSIS — J45909 Unspecified asthma, uncomplicated: Secondary | ICD-10-CM

## 2017-07-21 DIAGNOSIS — J4531 Mild persistent asthma with (acute) exacerbation: Secondary | ICD-10-CM | POA: Diagnosis not present

## 2017-07-21 MED ORDER — PREDNISONE 10 MG PO TABS: ORAL_TABLET | ORAL | 0 refills | Status: DC

## 2017-07-21 MED ORDER — AZITHROMYCIN 250 MG PO TABS: ORAL_TABLET | ORAL | 0 refills | Status: DC

## 2017-07-21 MED ORDER — BUDESONIDE-FORMOTEROL FUMARATE 160-4.5 MCG/ACT IN AERO: 2.0000 | INHALATION_SPRAY | Freq: Two times a day (BID) | RESPIRATORY_TRACT | 0 refills | Status: DC

## 2017-07-21 NOTE — Progress Notes (Signed)
Subjective:     Patient ID: Jonathan Jordan, male   DOB: Nov 13, 1980,     MRN: [id number]  HPI  Chief Complaint  Patient presents with  . Acute Visit    Pt has complaints of sinus congestion, nasal drainage with occ yellowish tint, and non productive cough that has been going on x1 week. Denies any CP or SOB.    36 yowm never smoker with asthma /rhinitis as child on allergy shots x teenager initially seemed to help then changed to drops by [facility] and then fasenra Dec 2018 then acutely worse cough/sob around 07/15/16 with nasal congestion / scratchy throat/ some yellowish mucus no cp / fever  maint on symb 160 one twice daily - has no rescue  No obvious patterns in  day to day or daytime variability or assoc  mucus plugs or hemoptysis or cp or chest tightness, subjective wheeze or overt sinus or hb symptoms. No unusual exposure hx or h/o childhood pna/ asthma or knowledge of premature birth.  Sleeping ok flat without nocturnal  or early am exacerbation  of respiratory  c/o's or need for noct saba. Also denies any obvious fluctuation of symptoms with weather or environmental changes or other aggravating or alleviating factors except as outlined above   Current Allergies, Complete Past Medical History, Past Surgical History, Family History, and Social History were reviewed in [employer] record.  ROS  The following are not active complaints unless bolded Hoarseness, sore throat, dysphagia, dental problems, itching, sneezing,  nasal congestion or discharge of excess mucus or purulent secretions, ear ache,   fever, chills, sweats, unintended wt loss or wt gain, classically pleuritic or exertional cp,  orthopnea pnd or leg swelling, presyncope, palpitations, abdominal pain, anorexia, nausea, vomiting, diarrhea  or change in bowel habits or change in bladder habits, change in stools or change in urine, dysuria, hematuria,  rash, arthralgias, visual complaints, headache,  numbness, weakness or ataxia or problems with walking or coordination,  change in mood/affect or memory.        Current Meds  Medication Sig  . amLODipine (NORVASC) 5 MG tablet Take 5 mg by mouth daily.  . budesonide-formoterol (SYMBICORT) 160-4.5 MCG/ACT inhaler Inhale 2 puffs into the lungs 2 (two) times daily.  . busPIRone (BUSPAR) 15 MG tablet Take 15 mg by mouth daily.   . cetirizine (ZYRTEC) 10 MG tablet Take 1 tablet (10 mg total) by mouth daily.  Marland Kitchen dicyclomine (BENTYL) 20 MG tablet Take 20 mg by mouth daily.   . fluticasone (FLONASE) 50 MCG/ACT nasal spray 2 sprays by Both Nostrils route at bedtime.  . GUAIFENESIN 1200 PO Take 1,200 mg 2 (two) times daily by mouth.  . sertraline (ZOLOFT) 25 MG tablet Take 25 mg daily by mouth.  . valACYclovir (VALTREX) 1000 MG tablet Take 1 tablet (1,000 mg total) by mouth daily. (Patient taking differently: Take 1,000 mg by mouth daily as needed. )  . [DISCONTINUED] ondansetron (ZOFRAN-ODT) 4 MG disintegrating tablet            Review of Systems     Objective:   Physical Exam   amb wm nad  Wt Readings from Last 3 Encounters:  07/21/17 231 lb 9.6 oz (105.1 kg)  06/21/17 226 lb 12.8 oz (102.9 kg)  05/12/17 229 lb 3.2 oz (104 kg)     Vital signs reviewed - Note on arrival 02 sats  97% on RA     HEENT: nl   oropharynx. Nl external  ear canals without cough reflex - moderate bilateral non-specific turbinate edema  / edentulous    NECK :  without JVD/Nodes/TM/ nl carotid upstrokes bilaterally   LUNGS: no acc muscle use,  Nl contour chest which is clear to A and P bilaterally without cough on insp or exp maneuvers   CV:  RRR  no s3 or murmur or increase in P2, and no edema   ABD:  soft and nontender with nl inspiratory excursion in the supine position. No bruits or organomegaly appreciated, bowel sounds nl  MS:  Nl gait/ ext warm without deformities, calf tenderness, cyanosis or clubbing No obvious joint restrictions   SKIN:  warm and dry without lesions    NEURO:  alert, approp, nl sensorium with  no motor or cerebellar deficits apparent.     Assessment:

## 2017-07-21 NOTE — Patient Instructions (Addendum)
zpak   If not better then take Prednisone 10 mg take  4 each am x 2 days,   2 each am x 2 days,  1 each am x 2 days and stop   Increase the symbicort 160 Take 2 puffs first thing in am and then another 2 puffs about 12 hours later.    Ok to give fosenra today   Need to regroup with Dr Craige CottaSood in 3 months  - call sooner if needed

## 2017-07-23 ENCOUNTER — Encounter: Payer: Self-pay | Admitting: Internal Medicine

## 2017-07-23 NOTE — Assessment & Plan Note (Signed)
07/21/2017  After extensive coaching inhaler device  effectiveness =    90%   Only using symb one bid (misunderstood instructions) but despite a typical URI trigger his lungs are clear so   1) increase symb 160 to 2bid  2) zpak 3) if not better > pred x 6 days 4) ok to continue fosenra   I had an extended discussion with the patient reviewing all relevant studies completed to date and  lasting 15 to 20 minutes of a 25 minute acute  visit  With pt new to me  Each maintenance medication was reviewed in detail including most importantly the difference between maintenance and prns and under what circumstances the prns are to be triggered using an action plan format that is not reflected in the computer generated alphabetically organized AVS.    Please see AVS for specific instructions unique to this visit that I personally wrote and verbalized to the the pt in detail and then reviewed with pt  by my nurse highlighting any  changes in therapy recommended at today's visit to their plan of care.

## 2017-07-28 ENCOUNTER — Telehealth: Payer: Self-pay | Admitting: Pulmonary Disease

## 2017-07-28 NOTE — Telephone Encounter (Signed)
error 

## 2017-07-31 NOTE — Telephone Encounter (Signed)
Pt. Had a fasenra shot 07/21/17. He is not due for another shot til 08/18/16. I spoke with Marisue IvanLiz and made her aware, pt's appt is not til middle of next month. We agreed there was no need to [facility] and turn around and have to start the process all over when pt. Has his policy #. (He can't get another shot in those 4 days anyway, too soon.) Called pt and made him aware. Pt. Understood and will fax his new ins. Card as soon as he gets it. Nothing further needed.

## 2017-07-31 NOTE — Telephone Encounter (Signed)
Will forward to TS. Pt is on Fasenra.

## 2017-08-15 ENCOUNTER — Encounter (HOSPITAL_BASED_OUTPATIENT_CLINIC_OR_DEPARTMENT_OTHER): Payer: Self-pay

## 2017-08-15 ENCOUNTER — Other Ambulatory Visit: Payer: Self-pay | Admitting: Urology

## 2017-08-15 ENCOUNTER — Other Ambulatory Visit: Payer: Self-pay

## 2017-08-15 NOTE — Patient Instructions (Addendum)
    Jonathan SimmersScott B Jordan  08/15/2017      Your procedure is scheduled on Tomorrow, Feb. 13, 2019    [facility]  At   6:30 A.M.    Call this number if you have problems the morning of [phone]               OUR ADDRESS IS [address] , WE ARE LOCATED IN THE              [facility].     Remember:   Do not eat food or drink liquids after midnight.     Take these medicines the morning of surgery with A SIP OF WATER:  None   Bring Asthma Inhaler   Do not wear jewelry, make-up or nail polish.   Do not wear lotions, powders, or perfumes, or deoderant.   Do not shave 48 hours prior to surgery.  Men may shave face and neck.   Do not bring valuables to the hospital.   [facility] any belongings or valuables.  Contacts, dentures or bridgework may not be worn into surgery.  Leave your suitcase in the car.  After surgery it may be brought to your room.  For patients admitted to the hospital, discharge time will be determined by your treatment team.   Special instructions:   Please read over the following fact sheets that you were given.

## 2017-08-15 NOTE — Progress Notes (Signed)
Spoke with:  Jonathan Jordan NPO:  After Midnight, no gum, candy, or mints   Arrival time: 6:30AM Labs: Istat4, EKG AM medications: none Pre op orders: Yes Ride home: Unsure, but will have someone to drive him home after surgery

## 2017-08-15 NOTE — Progress Notes (Signed)
Spoke with: Braxson NPO:  After Midnight, no gum, candy, or mints   Arrival time: 6:30AM Labs:  Istat4, EKG AM medications: None Pre op orders: No Ride home: Unsure at the moment

## 2017-08-16 ENCOUNTER — Ambulatory Visit (HOSPITAL_BASED_OUTPATIENT_CLINIC_OR_DEPARTMENT_OTHER): Payer: PRIVATE HEALTH INSURANCE | Admitting: Anesthesiology

## 2017-08-16 ENCOUNTER — Other Ambulatory Visit: Payer: Self-pay

## 2017-08-16 ENCOUNTER — Encounter (HOSPITAL_BASED_OUTPATIENT_CLINIC_OR_DEPARTMENT_OTHER): Payer: Self-pay | Admitting: *Deleted

## 2017-08-16 ENCOUNTER — Ambulatory Visit (HOSPITAL_BASED_OUTPATIENT_CLINIC_OR_DEPARTMENT_OTHER)
Admission: RE | Admit: 2017-08-16 | Discharge: 2017-08-16 | Disposition: A | Discharge status: Home / Self Care | Payer: PRIVATE HEALTH INSURANCE | Source: Ambulatory Visit | Attending: Urology | Admitting: Urology

## 2017-08-16 ENCOUNTER — Encounter (HOSPITAL_BASED_OUTPATIENT_CLINIC_OR_DEPARTMENT_OTHER)
Admission: RE | Disposition: A | Discharge status: Home / Self Care | Payer: Self-pay | Source: Ambulatory Visit | Attending: Urology

## 2017-08-16 DIAGNOSIS — K449 Diaphragmatic hernia without obstruction or gangrene: Secondary | ICD-10-CM | POA: Insufficient documentation

## 2017-08-16 DIAGNOSIS — Z809 Family history of malignant neoplasm, unspecified: Secondary | ICD-10-CM | POA: Insufficient documentation

## 2017-08-16 DIAGNOSIS — Z87442 Personal history of urinary calculi: Secondary | ICD-10-CM | POA: Insufficient documentation

## 2017-08-16 DIAGNOSIS — J45909 Unspecified asthma, uncomplicated: Secondary | ICD-10-CM | POA: Insufficient documentation

## 2017-08-16 DIAGNOSIS — Z888 Allergy status to other drugs, medicaments and biological substances status: Secondary | ICD-10-CM | POA: Insufficient documentation

## 2017-08-16 DIAGNOSIS — I10 Essential (primary) hypertension: Secondary | ICD-10-CM | POA: Insufficient documentation

## 2017-08-16 DIAGNOSIS — F419 Anxiety disorder, unspecified: Secondary | ICD-10-CM | POA: Diagnosis not present

## 2017-08-16 DIAGNOSIS — Z882 Allergy status to sulfonamides status: Secondary | ICD-10-CM | POA: Insufficient documentation

## 2017-08-16 DIAGNOSIS — Z880 Allergy status to penicillin: Secondary | ICD-10-CM | POA: Insufficient documentation

## 2017-08-16 DIAGNOSIS — Z881 Allergy status to other antibiotic agents status: Secondary | ICD-10-CM | POA: Diagnosis not present

## 2017-08-16 DIAGNOSIS — N202 Calculus of kidney with calculus of ureter: Secondary | ICD-10-CM | POA: Diagnosis not present

## 2017-08-16 DIAGNOSIS — N200 Calculus of kidney: Secondary | ICD-10-CM | POA: Diagnosis present

## 2017-08-16 HISTORY — DX: Herpesviral infection of other male genital organs: A60.02

## 2017-08-16 HISTORY — PX: CYSTOSCOPY/URETEROSCOPY/HOLMIUM LASER/STENT PLACEMENT: SHX6546

## 2017-08-16 HISTORY — DX: Anxiety disorder, unspecified: F41.9

## 2017-08-16 HISTORY — DX: Reserved for inherently not codable concepts without codable children: IMO0001

## 2017-08-16 HISTORY — DX: Personal history of urinary calculi: Z87.442

## 2017-08-16 HISTORY — DX: Other complications of anesthesia, initial encounter: T88.59XA

## 2017-08-16 HISTORY — DX: Pneumonia, unspecified organism: J18.9

## 2017-08-16 HISTORY — DX: Personal history of other diseases of the digestive system: Z87.19

## 2017-08-16 HISTORY — DX: Adverse effect of unspecified anesthetic, initial encounter: T41.45XA

## 2017-08-16 LAB — POCT I-STAT, CHEM 8
BUN: 11 mg/dL (ref 6–20)
CALCIUM ION: 1.19 mmol/L (ref 1.15–1.40)
CREATININE: 0.9 mg/dL (ref 0.61–1.24)
Chloride: 103 mmol/L (ref 101–111)
GLUCOSE: 103 mg/dL — AB (ref 65–99)
HEMATOCRIT: 39 % (ref 39.0–52.0)
HEMOGLOBIN: 13.3 g/dL (ref 13.0–17.0)
Potassium: 3.9 mmol/L (ref 3.5–5.1)
Sodium: 142 mmol/L (ref 135–145)
TCO2: 26 mmol/L (ref 22–32)

## 2017-08-16 SURGERY — CYSTOSCOPY/URETEROSCOPY/HOLMIUM LASER/STENT PLACEMENT
Anesthesia: General | Laterality: Right

## 2017-08-16 MED ORDER — DEXAMETHASONE SODIUM PHOSPHATE 10 MG/ML IJ SOLN
INTRAMUSCULAR | Status: AC
  Filled 2017-08-16: qty 1

## 2017-08-16 MED ORDER — HYDROCODONE-ACETAMINOPHEN 5-325 MG PO TABS
ORAL_TABLET | ORAL | Status: AC
  Filled 2017-08-16: qty 1

## 2017-08-16 MED ORDER — IOHEXOL 300 MG/ML  SOLN
INTRAMUSCULAR | Status: DC | PRN
  Administered 2017-08-16: 7 mL via INTRAVENOUS

## 2017-08-16 MED ORDER — ONDANSETRON HCL 4 MG/2ML IJ SOLN
INTRAMUSCULAR | Status: AC
  Filled 2017-08-16: qty 2

## 2017-08-16 MED ORDER — CIPROFLOXACIN HCL 500 MG PO TABS: 500.0000 mg | ORAL_TABLET | Freq: Two times a day (BID) | ORAL | 0 refills | Status: AC

## 2017-08-16 MED ORDER — FENTANYL CITRATE (PF) 100 MCG/2ML IJ SOLN
25.0000 ug | INTRAMUSCULAR | Status: DC | PRN
  Administered 2017-08-16: 50 ug via INTRAVENOUS
  Administered 2017-08-16 (×3): 25 ug via INTRAVENOUS
  Filled 2017-08-16: qty 1

## 2017-08-16 MED ORDER — FENTANYL CITRATE (PF) 100 MCG/2ML IJ SOLN
INTRAMUSCULAR | Status: DC | PRN
  Administered 2017-08-16 (×3): 25 ug via INTRAVENOUS
  Administered 2017-08-16: 50 ug via INTRAVENOUS
  Administered 2017-08-16 (×3): 25 ug via INTRAVENOUS

## 2017-08-16 MED ORDER — PROPOFOL 10 MG/ML IV BOLUS
INTRAVENOUS | Status: DC | PRN
  Administered 2017-08-16: 300 mg via INTRAVENOUS
  Administered 2017-08-16: 50 mg via INTRAVENOUS
  Administered 2017-08-16: 30 mg via INTRAVENOUS

## 2017-08-16 MED ORDER — FENTANYL CITRATE (PF) 100 MCG/2ML IJ SOLN
INTRAMUSCULAR | Status: AC
  Filled 2017-08-16: qty 2

## 2017-08-16 MED ORDER — ONDANSETRON HCL 4 MG PO TABS: 4.0000 mg | ORAL_TABLET | Freq: Every day | ORAL | 1 refills | Status: AC | PRN

## 2017-08-16 MED ORDER — ONDANSETRON HCL 4 MG/2ML IJ SOLN
INTRAMUSCULAR | Status: DC | PRN
  Administered 2017-08-16: 4 mg via INTRAVENOUS

## 2017-08-16 MED ORDER — CIPROFLOXACIN IN D5W 400 MG/200ML IV SOLN
400.0000 mg | Freq: Once | INTRAVENOUS | Status: AC
  Administered 2017-08-16: 400 mg via INTRAVENOUS
  Filled 2017-08-16: qty 200

## 2017-08-16 MED ORDER — MIDAZOLAM HCL 5 MG/5ML IJ SOLN
INTRAMUSCULAR | Status: DC | PRN
  Administered 2017-08-16: 2 mg via INTRAVENOUS

## 2017-08-16 MED ORDER — KETOROLAC TROMETHAMINE 30 MG/ML IJ SOLN
30.0000 mg | Freq: Once | INTRAMUSCULAR | Status: DC | PRN
  Filled 2017-08-16: qty 1

## 2017-08-16 MED ORDER — HYDROCODONE-ACETAMINOPHEN 5-325 MG PO TABS: 1.0000 | ORAL_TABLET | ORAL | 0 refills | Status: DC | PRN

## 2017-08-16 MED ORDER — MIDAZOLAM HCL 2 MG/2ML IJ SOLN
INTRAMUSCULAR | Status: AC
  Filled 2017-08-16: qty 2

## 2017-08-16 MED ORDER — BELLADONNA ALKALOIDS-OPIUM 16.2-60 MG RE SUPP
RECTAL | Status: AC
  Filled 2017-08-16: qty 1

## 2017-08-16 MED ORDER — PROMETHAZINE HCL 25 MG/ML IJ SOLN
6.2500 mg | INTRAMUSCULAR | Status: DC | PRN
  Filled 2017-08-16: qty 1

## 2017-08-16 MED ORDER — LIDOCAINE HCL (CARDIAC) 20 MG/ML IV SOLN
INTRAVENOUS | Status: DC | PRN
  Administered 2017-08-16: 100 mg via INTRAVENOUS

## 2017-08-16 MED ORDER — KETOROLAC TROMETHAMINE 30 MG/ML IJ SOLN
INTRAMUSCULAR | Status: AC
  Filled 2017-08-16: qty 1

## 2017-08-16 MED ORDER — PROPOFOL 10 MG/ML IV BOLUS
INTRAVENOUS | Status: AC
  Filled 2017-08-16: qty 40

## 2017-08-16 MED ORDER — OXYBUTYNIN CHLORIDE ER 10 MG PO TB24: 10.0000 mg | ORAL_TABLET | Freq: Every day | ORAL | 0 refills | Status: DC

## 2017-08-16 MED ORDER — DEXAMETHASONE SODIUM PHOSPHATE 10 MG/ML IJ SOLN
INTRAMUSCULAR | Status: DC | PRN
  Administered 2017-08-16: 10 mg via INTRAVENOUS

## 2017-08-16 MED ORDER — LACTATED RINGERS IV SOLN
INTRAVENOUS | Status: DC
  Administered 2017-08-16: 08:00:00 via INTRAVENOUS
  Administered 2017-08-16: 1000 mL via INTRAVENOUS
  Filled 2017-08-16: qty 1000

## 2017-08-16 MED ORDER — SODIUM CHLORIDE 0.9 % IR SOLN
Status: DC | PRN
  Administered 2017-08-16 (×2): 3000 mL

## 2017-08-16 MED ORDER — KETOROLAC TROMETHAMINE 30 MG/ML IJ SOLN
INTRAMUSCULAR | Status: DC | PRN
  Administered 2017-08-16: 30 mg via INTRAVENOUS

## 2017-08-16 MED ORDER — LIDOCAINE 2% (20 MG/ML) 5 ML SYRINGE
INTRAMUSCULAR | Status: AC
  Filled 2017-08-16: qty 5

## 2017-08-16 MED ORDER — PHENAZOPYRIDINE HCL 200 MG PO TABS: 200.0000 mg | ORAL_TABLET | Freq: Three times a day (TID) | ORAL | 0 refills | Status: AC | PRN

## 2017-08-16 MED ORDER — BELLADONNA ALKALOIDS-OPIUM 16.2-60 MG RE SUPP
RECTAL | Status: DC | PRN
  Administered 2017-08-16: 1 via RECTAL

## 2017-08-16 MED ORDER — CIPROFLOXACIN IN D5W 400 MG/200ML IV SOLN
INTRAVENOUS | Status: AC
  Filled 2017-08-16: qty 200

## 2017-08-16 MED ORDER — HYDROCODONE-ACETAMINOPHEN 5-325 MG PO TABS
1.0000 | ORAL_TABLET | ORAL | Status: DC | PRN
  Administered 2017-08-16: 1 via ORAL
  Filled 2017-08-16: qty 1

## 2017-08-16 SURGICAL SUPPLY — 25 items
BAG DRAIN URO-CYSTO SKYTR STRL (DRAIN) ×2 IMPLANT
BASKET STONE 1.7 NGAGE (UROLOGICAL SUPPLIES) IMPLANT
BASKET ZERO TIP NITINOL 2.4FR (BASKET) ×2 IMPLANT
BENZOIN TINCTURE PRP APPL 2/3 (GAUZE/BANDAGES/DRESSINGS) IMPLANT
CATH INTERMIT  6FR 70CM (CATHETERS) IMPLANT
CATH URET 5FR 28IN OPEN ENDED (CATHETERS) ×2 IMPLANT
CLOTH BEACON ORANGE TIMEOUT ST (SAFETY) ×2 IMPLANT
FIBER LASER FLEXIVA 365 (UROLOGICAL SUPPLIES) IMPLANT
FIBER LASER TRAC TIP (UROLOGICAL SUPPLIES) IMPLANT
GLOVE BIO SURGEON STRL SZ7.5 (GLOVE) ×2 IMPLANT
GOWN STRL REUS W/TWL XL LVL3 (GOWN DISPOSABLE) IMPLANT
GUIDEWIRE ANG ZIPWIRE 038X150 (WIRE) ×2 IMPLANT
GUIDEWIRE STR DUAL SENSOR (WIRE) IMPLANT
INFUSOR MANOMETER BAG 3000ML (MISCELLANEOUS) ×2 IMPLANT
IV NS 1000ML (IV SOLUTION)
IV NS 1000ML BAXH (IV SOLUTION) IMPLANT
IV NS IRRIG 3000ML ARTHROMATIC (IV SOLUTION) ×2 IMPLANT
KIT RM TURNOVER CYSTO AR (KITS) ×2 IMPLANT
MANIFOLD NEPTUNE II (INSTRUMENTS) ×2 IMPLANT
NS IRRIG 500ML POUR BTL (IV SOLUTION) ×4 IMPLANT
PACK CYSTO (CUSTOM PROCEDURE TRAY) ×2 IMPLANT
STENT URET 6FRX26 CONTOUR (STENTS) ×2 IMPLANT
STRIP CLOSURE SKIN 1/2X4 (GAUZE/BANDAGES/DRESSINGS) IMPLANT
SYRINGE 10CC LL (SYRINGE) ×2 IMPLANT
TUBE CONNECTING 12X1/4 (SUCTIONS) IMPLANT

## 2017-08-16 NOTE — Discharge Instructions (Signed)
°  Post Anesthesia Home Care Instructions  Activity: Get plenty of rest for the remainder of the day. A responsible individual must stay with you for 24 hours following the procedure.  For the next 24 hours, DO NOT: -Drive a car -Advertising copywriterperate machinery -Drink alcoholic beverages -Take any medication unless instructed by your physician -Make any legal decisions or sign important papers.  Meals: Start with liquid foods such as gelatin or soup. Progress to regular foods as tolerated. Avoid greasy, spicy, heavy foods. If nausea and/or vomiting occur, drink only clear liquids until the nausea and/or vomiting subsides. Call your physician if vomiting continues.  Special Instructions/Symptoms: Your throat may feel dry or sore from the anesthesia or the breathing tube placed in your throat during surgery. If this causes discomfort, gargle with warm salt water. The discomfort should disappear within 24 hours.  Do not take any nonsteroidal anti inflammatories (advil, Ibuprofen, Motrin, aleve) until after 3:oo pm today.

## 2017-08-16 NOTE — Anesthesia Postprocedure Evaluation (Signed)
Anesthesia Post Note  Patient: Jonathan Jordan  Procedure(s) Performed: CYSTOSCOPY/RETROGRADE/URETEROSCOPY/HOLMIUM LASER/STENT PLACEMENT, STONE BASKET RETRIVAL (Right )     Patient location during evaluation: PACU Anesthesia Type: General Level of consciousness: awake and alert Pain management: pain level controlled Vital Signs Assessment: post-procedure vital signs reviewed and stable Respiratory status: spontaneous breathing, nonlabored ventilation, respiratory function stable and patient connected to nasal cannula oxygen Cardiovascular status: blood pressure returned to baseline and stable Postop Assessment: no apparent nausea or vomiting Anesthetic complications: no    Last Vitals:  Vitals:   08/16/17 1007 08/16/17 1015  BP:  132/82  Pulse: 70 73  Resp: 13 19  Temp:    SpO2: 96% 96%    Last Pain:  Vitals:   08/16/17 1015  TempSrc:   PainSc: 3                  Ryker Pherigo S

## 2017-08-16 NOTE — Transfer of Care (Signed)
Immediate Anesthesia Transfer of Care Note  Patient: Laneta SimmersScott B Leason  Procedure(s) Performed: Procedure(s) (LRB): CYSTOSCOPY/RETROGRADE/URETEROSCOPY/HOLMIUM LASER/STENT PLACEMENT, STONE BASKET RETRIVAL (Right)  Patient Location: PACU  Anesthesia Type: General  Level of Consciousness: awake, sedated, patient cooperative and responds to stimulation  Airway & Oxygen Therapy: Patient Spontanous Breathing and Patient connected to [place] O2  Post-op Assessment: Report given to PACU RN, Post -op Vital signs reviewed and stable and Patient moving all extremities  Post vital signs: Reviewed and stable  Complications: No apparent anesthesia complications

## 2017-08-16 NOTE — Op Note (Signed)
Operative Note  Preoperative diagnosis:  1.  4 mm right distal ureteral and 7 mm right renal pelvic stones  Postoperative diagnosis: 1.  Same  Procedure(s): 1.  Cystoscopy with right retrograde pyelogram with intraoperative interpretation of fluoroscopic imaging 2.  Right ureteroscopy 3.  Holmium laser lithotripsy  4.  Right JJ stent placement with tether  Surgeon: Rhoderick Moodyhristopher Londin Antone, MD  Assistants: None  Anesthesia: General LMA  Complications: None  EBL: Less than 5 mL  Specimens: 1.  Right ureteral stone  Drains/Catheters: 1.  Right 6 [place] JJ stent with tether  Intraoperative findings:  1.  No filling defect seen on right retrograde pyelogram 2.  4 mm right distal and 7 mm renal pelvic stones  Indication:  Jonathan Jordan is a 37 y.o. male with a history of nephrolithiasis who presents to the office today with a 10 day history of right flank pain that radiates to the right inguinal region and is associated with nausea/vomiting. He was recently evaluated at an OSH and had a CT stone study done that demonstrated 4 mm right distal and 7 mm right renal stones. No prior stone related surgeries. He states that he usually passes small stones without any issues. Denies gross hematuria or fever/chills.   He is here today for the after mentioned procedures.  The risk, benefits and alternatives were discussed preoperatively.  He voices understanding and wishes to proceed.  Description of procedure:  After informed consent was obtained, the patient was brought to the operating room and general LMA anesthesia was administered. The patient was then placed in the dorsolithotomy position and prepped and draped in usual sterile fashion. A timeout was performed. A 21 French rigid cystoscope was then inserted into the urethral meatus and advanced into the bladder under direct vision. A complete bladder survey revealed no intravesical pathology.  A 5 French open-ended catheter was then  inserted into the right ureteral orifice.  Pyelogram was obtained that showed no obvious filling defects along the entire length of the right ureter nor within the right renal pelvis and its associated calyces.  A Glidewire was then advanced through the lumen of the ureteral catheter and up to the right renal pelvis, under fluoroscopic guidance.  A semirigid ureteroscope was then advanced alongside the wire until his obstructing right distal stone was identified.  A 200 m holmium laser was then used to fracture the stone into 2 smaller fragments.  A tipless basket was then used to extract all stone fragments from the lumen of the right ureter.  The semirigid ureteroscope was then removed and a flexible ureteroscope was then advanced over the wire up to the right renal pelvis.  His 7 mm stone was identified in the right mid pole calyx.  The holmium laser was then used to fracture the stone into submillimeter fragments.  The wire was then replaced through the lumen of the scope and the flexible ureteroscope was then removed over the wire, identifying no ureteral injury upon removal.  The rigid cystoscope was then reinserted over the wire and a 6 [place] JJ stent was then placed within the right collecting system, under fluoroscopic guidance.  The tether of the stent was left intact.  The patient's bladder was then drained and all stone fragments were removed.  The patient tolerated the procedure well and was transferred to the postanesthesia unit in stable condition.  Plan: The patient has been instructed to remove his stent, which is on a tether, on 08/21/2017 at 6 AM.

## 2017-08-16 NOTE — Anesthesia Procedure Notes (Signed)
Procedure Name: LMA Insertion Date/Time: 08/16/2017 8:33 AM Performed by: Jessica PriestBeeson, Madalene Mickler C, CRNA Pre-anesthesia Checklist: Patient identified, Emergency Drugs available, Suction available and Patient being monitored Patient Re-evaluated:Patient Re-evaluated prior to induction Oxygen Delivery Method: Circle system utilized Preoxygenation: Pre-oxygenation with 100% oxygen Induction Type: IV induction Ventilation: Mask ventilation without difficulty LMA: LMA inserted LMA Size: 5.0 Number of attempts: 1 Airway Equipment and Method: Bite block Placement Confirmation: positive ETCO2 and breath sounds checked- equal and bilateral Tube secured with: Tape Dental Injury: Teeth and Oropharynx as per pre-operative assessment

## 2017-08-16 NOTE — H&P (Signed)
Urology Preoperative H&P   Chief Complaint: Right flank pain  History of Present Illness: Jonathan Jordan is a 37 y.o. male with a history of nephrolithiasis who presents to the office today with a 10 day history of right flank pain that radiates to the right inguinal region and is associated with nausea/vomiting. He was recently evaluated at an OSH and had a CT stone study done that demonstrated 4 mm right distal and 7 mm right renal stones. No prior stone related surgeries. He states that he usually passes small stones without any issues. Denies gross hematuria or fever/chills.     Past Medical History:  Diagnosis Date  . Allergy   . Anxiety   . Asthma   . Complication of anesthesia    slow to wake up  . Her disease (HCC)   . Herpes genitalis in men   . History of hiatal hernia   . History of kidney stones   . Hypertension   . Pneumonia    x4, none in past 10 years    Past Surgical History:  Procedure Laterality Date  . HIATAL HERNIA REPAIR  03/14/2017  . NISSEN FUNDOPLICATION  03/14/2017  . SINUS SURGERY WITH INSTATRAK  08/17/2016  . TONSILLECTOMY AND ADENOIDECTOMY  10/2011  . UPPER GASTROINTESTINAL ENDOSCOPY  2018   X2, [place]  . WISDOM TOOTH EXTRACTION      Allergies:  Allergies  Allergen Reactions  . Bactrim     Abdominal pain   . Ceclor [Cefaclor] Other (See Comments)    Abdominal pain  . Cephalexin Swelling  . Penicillins Swelling  . Clindamycin/Lincomycin Rash    Family History  Problem Relation Age of Onset  . Cancer Maternal Grandmother   . Cancer Paternal Grandmother     Social History:  reports that  has never smoked. he has never used smokeless tobacco. He reports that he drinks alcohol. He reports that he does not use drugs.  ROS: A complete review of systems was performed.  All systems are negative except for pertinent findings as noted.  Physical Exam:  Vital signs in last 24 hours: Temp:  [97.6 F (36.4 C)-98.1 F  (36.7 C)] 97.6 F (36.4 C) (02/13 84130632) Pulse Rate:  [64-66] 66 (02/13 0632) Resp:  [16] 16 (02/13 0632) BP: (136-137)/(85-87) 136/85 (02/13 0632) SpO2:  [99 %-100 %] 100 % (02/13 24400632) Weight:  [102.1 kg (225 lb)-104 kg (229 lb 3.2 oz)] 104 kg (229 lb 3.2 oz) (02/13 10270632) Constitutional:  Alert and oriented, No acute distress Cardiovascular: Regular rate and rhythm, No JVD Respiratory: Normal respiratory effort, Lungs clear bilaterally GI: Abdomen is soft, nontender, nondistended, no abdominal masses GU: Right CVA tenderness Lymphatic: No lymphadenopathy Neurologic: Grossly intact, no focal deficits Psychiatric: Normal mood and affect  Laboratory Data:  Recent Labs    08/16/17 0747  HGB 13.3  HCT 39.0    Recent Labs    08/16/17 0747  NA 142  K 3.9  CL 103  GLUCOSE 103*  BUN 11  CREATININE 0.90     Results for orders placed or performed during the hospital encounter of 08/16/17 (from the past 24 hour(s))  I-STAT, chem 8     Status: Abnormal   Collection Time: 08/16/17  7:47 AM  Result Value Ref Range   Sodium 142 135 - 145 mmol/L   Potassium 3.9 3.5 - 5.1 mmol/L   Chloride 103 101 - 111 mmol/L   BUN 11 6 - 20 mg/dL  Creatinine, Ser 0.90 0.61 - 1.24 mg/dL   Glucose, Bld 161 (H) 65 - 99 mg/dL   Calcium, Ion 0.96 0.45 - 1.40 mmol/L   TCO2 26 22 - 32 mmol/L   Hemoglobin 13.3 13.0 - 17.0 g/dL   HCT 40.9 81.1 - 91.4 %   No results found for this or any previous visit (from the past 240 hour(s)).  Renal Function: Recent Labs    08/16/17 0747  CREATININE 0.90   Estimated Creatinine Clearance: 138.8 mL/min (by C-G formula based on SCr of 0.9 mg/dL).  Radiologic Imaging: No results found.  I independently reviewed the above imaging studies.  Assessment and Plan Jonathan Jordan is a 37 y.o. male with right ureteral and renal pelvic stones  - The risks, benefits and alternatives of cystoscopy with right ureteroscopy, laser lithotripsy and ureteral stent  placement was discussed the patient. Risks included, but are not limited to: bleeding, urinary tract infection, ureteral injury/avulsion, ureteral stricture formation, retained stone fragments, the possibility that multiple surgeries may be required to treat the stone(s) and the inherent risks of general anesthesia. The patient voices understanding and wishes to proceed.     Rhoderick Moody, MD 08/16/2017, 8:00 AM  [facility] Pager: [phone]

## 2017-08-16 NOTE — Anesthesia Preprocedure Evaluation (Signed)
Anesthesia Evaluation  Patient identified by MRN, date of birth, ID band Patient awake    Reviewed: Allergy & Precautions, NPO status , Patient's Chart, lab work & pertinent test results  Airway Mallampati: II  TM Distance: >3 FB Neck ROM: Full    Dental no notable dental hx.    Pulmonary neg pulmonary ROS,    Pulmonary exam normal breath sounds clear to auscultation       Cardiovascular hypertension, negative cardio ROS Normal cardiovascular exam Rhythm:Regular Rate:Normal     Neuro/Psych negative neurological ROS  negative psych ROS   GI/Hepatic negative GI ROS, Neg liver ROS,   Endo/Other  negative endocrine ROS  Renal/GU negative Renal ROS  negative genitourinary   Musculoskeletal negative musculoskeletal ROS (+)   Abdominal   Peds negative pediatric ROS (+)  Hematology negative hematology ROS (+)   Anesthesia Other Findings   Reproductive/Obstetrics negative OB ROS                             Anesthesia Physical Anesthesia Plan  ASA: II  Anesthesia Plan: General   Post-op Pain Management:    Induction: Intravenous  PONV Risk Score and Plan: 2 and Ondansetron and Dexamethasone  Airway Management Planned: LMA  Additional Equipment:   Intra-op Plan:   Post-operative Plan: Extubation in OR  Informed Consent: I have reviewed the patients History and Physical, chart, labs and discussed the procedure including the risks, benefits and alternatives for the proposed anesthesia with the patient or authorized representative who has indicated his/her understanding and acceptance.   Dental advisory given  Plan Discussed with: CRNA and Surgeon  Anesthesia Plan Comments:         Anesthesia Quick Evaluation

## 2017-08-17 ENCOUNTER — Encounter (HOSPITAL_BASED_OUTPATIENT_CLINIC_OR_DEPARTMENT_OTHER): Payer: Self-pay | Admitting: Urology

## 2017-08-28 ENCOUNTER — Telehealth: Payer: Self-pay | Admitting: Pulmonary Disease

## 2017-08-28 NOTE — Telephone Encounter (Signed)
Called and spoke with pt who stated he had faxed over his insurance card for Jonathan Jordan.  Pt is wanting [name] to call him due to some questions he has that he wants to ask her about.  Stated to pt that I would route this to [name] to make her aware pt is wanting her to call him.  Best number to reach pt on is [phone]

## 2017-08-30 NOTE — Telephone Encounter (Signed)
[name], please advise if you did receive the fax of pt's insurance cards and if you were answer questions pt had that he wanted to speak with you about.  Thanks!

## 2017-09-04 ENCOUNTER — Telehealth: Payer: Self-pay | Admitting: *Deleted

## 2017-09-04 NOTE — Telephone Encounter (Signed)
Yes, I called pt. Back last wk. And today. Will check on status of p/a .

## 2017-09-04 NOTE — Telephone Encounter (Signed)
[name] please advise. Thanks. 

## 2017-09-05 NOTE — Telephone Encounter (Signed)
Jonathan Jordan please advise.  

## 2017-09-05 NOTE — Telephone Encounter (Signed)
Will route this to [name] to follow up on.  

## 2017-09-12 NOTE — Telephone Encounter (Signed)
[name], Please advise if anything else is needed for this phone call. Thanks!

## 2017-09-13 NOTE — Telephone Encounter (Signed)
[name] please provide an update on this phone call. Thanks.

## 2017-09-13 NOTE — Telephone Encounter (Signed)
Received insurance card, working with company  For info. To start med..Marland Kitchen

## 2017-09-18 NOTE — Telephone Encounter (Signed)
Routing this to TS to follow up on.  

## 2017-09-18 NOTE — Telephone Encounter (Signed)
Patient called and is checking on status of insurance approval for injection -He can be reached at [phone] -pr

## 2017-09-21 MED ORDER — BENRALIZUMAB 30 MG/ML ~~LOC~~ SOSY
30.0000 mg | PREFILLED_SYRINGE | Freq: Once | SUBCUTANEOUS | Status: AC
  Administered 2017-07-21: 30 mg via SUBCUTANEOUS

## 2017-09-21 NOTE — Progress Notes (Signed)
xolair

## 2017-09-26 NOTE — Telephone Encounter (Signed)
[name] please advise on an update at this message has been open for over 1 month. Thanks.

## 2017-09-26 NOTE — Telephone Encounter (Signed)
Didn't get Ins. Card until the March. Filled out new enrollment form for 2019. VS signed it and we faxed it. [employer] sent confirmation they received the form. Said they would contact us regarding next steps.

## 2017-09-29 ENCOUNTER — Telehealth: Payer: Self-pay | Admitting: Pulmonary Disease

## 2017-09-29 MED ORDER — EPINEPHRINE 0.3 MG/0.3ML IJ SOAJ: 0.3000 mg | Freq: Once | INTRAMUSCULAR | 11 refills | Status: AC

## 2017-09-29 NOTE — Telephone Encounter (Signed)
I also called pt's ins., he needs a p/a. [facility] is going to send me the form, I'll fill it out and have VS sign it. Called pt. Back to make him aware of what I found out. Pt. Told me [place] was going to be able to take care of the $50 dollar cp-pay, they told me the opposite. He also needed a refill on his Epi-pen and wanted [place] [facility] [place]Marland Kitchen

## 2017-09-29 NOTE — Telephone Encounter (Signed)
Called access [employer] b/c we did not receive pt's summary of benefits. Pt's ins. Will cover the cost of the fasenra, but pt. Has a co-pay.

## 2017-09-29 NOTE — Telephone Encounter (Signed)
I tried to call rx in, I had to leave a message. I'll keep trying. I hope they'll call me back. Nothing further needed.

## 2017-09-29 NOTE — Telephone Encounter (Signed)
Pt is calling back [phone] Pt said he really needs the [place]ra shot

## 2017-09-29 NOTE — Telephone Encounter (Signed)
Routing this back to Jonathan Jordan 

## 2017-10-04 NOTE — Telephone Encounter (Signed)
Spoke with [employer]-they are faxing over benefit summary and stated that patient can go through any spec. Pharmacy except [facility] for Foot LockerFasenra RX. This would be under the medical side of benefits-note to pharmacy. Will wait for benefits via fax.

## 2017-10-05 ENCOUNTER — Telehealth: Payer: Self-pay | Admitting: Pulmonary Disease

## 2017-10-05 NOTE — Telephone Encounter (Signed)
I went on the website and printed out the form. It doesn't say anything about free product though. Kelli, I didn't get a chance to look into this further would you mind doing that? I'm sorry,I'll be back Monday.

## 2017-10-05 NOTE — Telephone Encounter (Signed)
Patient calling back stating.  He states he just spoke with pharmacy and found out that Dr. Craige CottaSood sent RX for Auvi-q but ins would not cover it. [employer]vi-q has program through website to get this for free and would like to know if this is an option and if Dr. Craige CottaSood could fill out paperwork that is on website for this. He states he thinks this may have been done for him last year. He states if that does not work, then will need RX sent over for Epi-Pen as he is running out of time.  CB is [phone].

## 2017-10-06 NOTE — Telephone Encounter (Signed)
Called and spoke to patient regarding two concerns Pt is concerned about retrieving Epinephrine injection; script runs out in one week Went to [employer] online site, printed the patient application Completed the application with pt info, placed in VS green to do folder today in cubby To sign and complete the remaining portions of the application  Addressed pt's second concern which is status of his [employer]asera RX Called and spoke with [facility] at phone [phone] She advised the benefit form was completed in September 22 2017 The pt's medical benefits covered by [facility]; can use specialty pharmacy listed not to [facility] no PA is required for this medication of Fraserna; no co-pay at this time Specialty pharmacies include [facility], [facility], and [facility]   Called and spoke with Hassell DoneVenessa with [facility] regarding Ewell PoeFrasera, phone [phone] She states that Ewell PoeFrasera is not covered under patient's insurance; they cannot call anyone else due to having no HIPPA form to speak to an 3rd parties.  Called [facility] at [phone] and LVM for s/o to call back regarding pt's Ewell PoeFrasera script Talked with Bernadene PersonLizz; she will follow up on status of Frasera

## 2017-10-11 NOTE — Telephone Encounter (Signed)
Marisue IvanLiz, please advise if you have an update for us on pt. Thanks!

## 2017-10-12 NOTE — Telephone Encounter (Signed)
Left VM for pt today, faxed docs completed and signed to [employer] for epinephrine injection. X1

## 2017-10-16 NOTE — Telephone Encounter (Signed)
Please provide update.  Thanks.

## 2017-10-16 NOTE — Telephone Encounter (Signed)
Pt states [employer] neds to know how many pens to send, strength, amount of refills. Pt normally gets two pens. Cb is [phone] [employer] number phone [phone].

## 2017-10-17 ENCOUNTER — Encounter: Payer: Self-pay | Admitting: Pulmonary Disease

## 2017-10-17 NOTE — Telephone Encounter (Signed)
Jonathan Jordan called pt's ins. And faxed clinicals and p/a. It was denied. She asked VS to write an appeal letter , I just faxed it. Will wait to hear if they are going to approve fasenra.

## 2017-10-17 NOTE — Telephone Encounter (Signed)
Called [employer] gave needed info.. ( I wasn't here last wk.) Nothing further needed.

## 2017-10-17 NOTE — Telephone Encounter (Signed)
[name], please advise if everything has been received for pt (benefits, insurance, etc) and if you have updated pt.  Also please advise if this encounter can be closed.

## 2017-10-17 NOTE — Progress Notes (Signed)
04/17/17, 07/21/17 

## 2017-10-18 NOTE — Telephone Encounter (Signed)
Will route this back to [facility]b to follow up on once she hears back.

## 2017-10-23 ENCOUNTER — Telehealth: Payer: Self-pay | Admitting: Pulmonary Disease

## 2017-10-23 ENCOUNTER — Ambulatory Visit: Payer: BLUE CROSS/BLUE SHIELD | Admitting: Pulmonary Disease

## 2017-10-24 NOTE — Telephone Encounter (Signed)
Called [employer] will have to go thru [place]. I called pt. He is going to call them and see if he qualifies, giving them the info. They need. If he qualifies he will print out the app. Fill it out and fax it to us. Waiting on fax from pt. Or ph. Call from pt..Marland Kitchen

## 2017-10-24 NOTE — Telephone Encounter (Signed)
See previous phone message for updates.

## 2017-10-25 ENCOUNTER — Telehealth: Payer: Self-pay | Admitting: Pulmonary Disease

## 2017-10-25 NOTE — Telephone Encounter (Signed)
Spoke with pt, He called [place] last night, he does qualify. He filled out the app. Online and printed it out. He is going to try to fax it today along with the info. [place] needs. I gave him the triage fax # so I'll know we'll get it. I will fill out our part of the app. And [facility]. Nothing further needed.

## 2017-10-26 NOTE — Telephone Encounter (Signed)
I have not received pt's fax nor have I heard from him.

## 2017-10-30 ENCOUNTER — Telehealth: Payer: Self-pay | Admitting: Pulmonary Disease

## 2017-10-30 NOTE — Telephone Encounter (Signed)
I don't handle the paperwork for injections. Please call patient about this. Thanks.

## 2017-10-30 NOTE — Telephone Encounter (Addendum)
Pt. Called you back, I spoke with him. I couldn't find the paperwork on th triage fax and it wasn't in my folder. I gave him our address to mail it to [place], then he said he was coming to to [facility] this wk. He may just stop by and hand deliver it to me. He also said he may find somewhere else to fax it and fax it again. Ask pt. To fax it to [phone] this time if he decides to fax paperwork again.

## 2017-10-30 NOTE — Telephone Encounter (Signed)
You didn't receive pt's [place] paperwork? Pt's message said he would like a cb if the paperwork did come in.

## 2017-10-30 NOTE — Telephone Encounter (Signed)
Called patient, unable to reach left message to give [place] a call back. Will route this to TS to follow up on this.

## 2017-10-31 NOTE — Telephone Encounter (Signed)
Closing this note please refer to 10/30/17 ph. Note. (duplicate)

## 2017-11-01 NOTE — Telephone Encounter (Signed)
Pt hand delivered [place] paperwork to me today. I filled out our portion and VS signed it. Going to fax to [facility] & ME, urgent will hopefully only take up to 72 hrs. Waiting.

## 2017-11-06 NOTE — Telephone Encounter (Signed)
Please advise on this, thank you!

## 2017-11-09 NOTE — Telephone Encounter (Signed)
[name] please advise. Thanks. 

## 2017-11-09 NOTE — Telephone Encounter (Signed)
Spoke with pt, I haven't heard from [facility] & ME. I will call them and check the status.  They said they faxed [place] a product request form. We did not receive it or I would have filled it out and faxed it. The form is filled out I just need VS to sign it. I put the form on his desk.

## 2017-11-16 NOTE — Telephone Encounter (Signed)
[name] could you please give an update on this mater? Thanks.

## 2017-11-22 NOTE — Telephone Encounter (Signed)
[name] please advise. Thanks. 

## 2017-11-22 NOTE — Telephone Encounter (Signed)
Fasenra product request form was just signed by VS recently, faxed it. Normally they ask for a copy of ins. Card. Pt. Is currently without ins.. Noted on form, so they wouldn't ask for it. Will wait to hear if pt. Is approved or not.

## 2017-11-24 NOTE — Telephone Encounter (Signed)
I have not heard from [facility] & ME. If I don't get a fax from them I'll call Tues.Marland Kitchen

## 2017-11-28 NOTE — Telephone Encounter (Signed)
Contacted [place] and [place] for updated on Fasenra patient assistance. They state the received the Product request form-need to resend with correct dosing and cover sheet. Was told to call tomorrow and have processed STAT over the phone.

## 2017-11-29 ENCOUNTER — Telehealth: Payer: Self-pay | Admitting: *Deleted

## 2017-11-29 NOTE — Telephone Encounter (Signed)
Spoke with [place]-pt has been approved for Harrington Challenger and they will ship to our office in Friday.

## 2017-12-01 NOTE — Telephone Encounter (Signed)
1 prefilled syringe Ordered date: 11/29/17 Shipping date: 11/30/17

## 2017-12-01 NOTE — Telephone Encounter (Signed)
Pt's fasenra finally came in 12/01/17. Called pt., Made him aware he would have to wait 2 hrs. And to bring Epi-pen and let me see it. Nothing further needed.

## 2017-12-05 NOTE — Telephone Encounter (Signed)
Arrival Date:12/01/17 Lot #: [id number] Exp date: 03/2019

## 2017-12-08 ENCOUNTER — Telehealth: Payer: Self-pay | Admitting: Pulmonary Disease

## 2017-12-08 NOTE — Telephone Encounter (Signed)
I returned pt's call, he wanted to let me know he completely forgot about his shot this wk.Marland Kitchen. He will call wk after next to sch his appt. With me. (2 hr. Wait) Knows to bring Epi-pen. Nothing further needed.

## 2017-12-18 ENCOUNTER — Telehealth: Payer: Self-pay | Admitting: Pulmonary Disease

## 2017-12-18 NOTE — Telephone Encounter (Signed)
Pt said he can get the shot Faserna on 6/28, 6/29 what ever work for you.   [phone]

## 2017-12-18 NOTE — Telephone Encounter (Signed)
Called pt back, he is coming in on 12/29/17 at 10:30. Pt is aware of 2 hr wait since it's been months since his last inj.. (Per VS) Nothing further needed.

## 2017-12-26 ENCOUNTER — Telehealth: Payer: Self-pay

## 2017-12-26 NOTE — Telephone Encounter (Signed)
This message was in error, needs canceled.

## 2017-12-28 ENCOUNTER — Ambulatory Visit (INDEPENDENT_AMBULATORY_CARE_PROVIDER_SITE_OTHER): Payer: PRIVATE HEALTH INSURANCE

## 2017-12-28 DIAGNOSIS — J45909 Unspecified asthma, uncomplicated: Secondary | ICD-10-CM

## 2017-12-29 ENCOUNTER — Ambulatory Visit: Payer: PRIVATE HEALTH INSURANCE

## 2018-01-02 MED ORDER — BENRALIZUMAB 30 MG/ML ~~LOC~~ SOSY
30.0000 mg | PREFILLED_SYRINGE | Freq: Once | SUBCUTANEOUS | Status: AC
  Administered 2017-12-28: 30 mg via SUBCUTANEOUS

## 2018-01-19 ENCOUNTER — Telehealth: Payer: Self-pay | Admitting: Pulmonary Disease

## 2018-01-19 NOTE — Telephone Encounter (Signed)
1 prefilled syringe Ordered date: 01/19/18 Shipping date: 3 to 5 bus. days

## 2018-01-23 NOTE — Telephone Encounter (Signed)
Arrival Date:01/23/18 Lot #: [id number] Exp date: 01/2019 

## 2018-01-25 ENCOUNTER — Ambulatory Visit: Payer: PRIVATE HEALTH INSURANCE

## 2018-04-30 ENCOUNTER — Telehealth: Payer: Self-pay | Admitting: Pulmonary Disease

## 2018-04-30 NOTE — Telephone Encounter (Signed)
Called pt he needs to make an appt for an injection. Lmomtcb "Please make an appt with schedulers you haven't had an inj. 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 Will wait for pt to make an appt.Marland Kitchen

## 2018-05-09 NOTE — Telephone Encounter (Signed)
Pt has not made an appt.. I try him again 05/10/18

## 2018-05-24 NOTE — Telephone Encounter (Signed)
Pt still has not made an appt. Will call pt 05/25/18 to see if he can come in for his Harrington ChallengerFasenra.

## 2018-06-07 NOTE — Telephone Encounter (Signed)
I'll try try to reach pt 06/08/18.

## 2018-07-02 NOTE — Telephone Encounter (Signed)
Tried pt again had to leave a message. This time I gave pt our # and told him about the auto- injector, u can give to yourself at home. I also asked him to call back and leave a message for VS, asking him about giving his own injs. At home. I only mentioned it because the [place] for [employer] [place] something like it and has a hard time keeping his appts.. Pt's last inj. Was 12/22/17. His next appt should have been in Aug., I've tried to call him a few times. Routing to VS as FYI.

## 2018-07-02 NOTE — Telephone Encounter (Signed)
I returned pt's call, in talking with him he hasn't seen VS since Dec. Of last year. Pt said he would call the office and make an appt. With VS and talk to him and see if he will be able to give his injs. At home. To whom it concerns, Do Not give this back to me to see if he can be approved for the auto injector until VS has seen him and talked to him about it. Routing to [employer] StearnsKelli and [employer]. Thank You! [name] Nirvan

## 2018-07-02 NOTE — Telephone Encounter (Signed)
Pt is calling back [phone]

## 2018-07-02 NOTE — Telephone Encounter (Signed)
Routing to Hewlett-Packardammy Kallan so she can call pt back.

## 2018-07-03 NOTE — Telephone Encounter (Signed)
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

## 2020-03-04 ENCOUNTER — Ambulatory Visit: Payer: BLUE CROSS/BLUE SHIELD | Admitting: Primary Care

## 2020-03-05 ENCOUNTER — Other Ambulatory Visit: Payer: Self-pay

## 2020-03-05 ENCOUNTER — Encounter: Payer: Self-pay | Admitting: Primary Care

## 2020-03-05 ENCOUNTER — Ambulatory Visit: Payer: BLUE CROSS/BLUE SHIELD | Admitting: Primary Care

## 2020-03-05 VITALS — BP 132/80 | HR 84 | Temp 97.4°F | Ht 72.0 in | Wt 253.0 lb

## 2020-03-05 DIAGNOSIS — R0602 Shortness of breath: Secondary | ICD-10-CM | POA: Diagnosis not present

## 2020-03-05 DIAGNOSIS — J4531 Mild persistent asthma with (acute) exacerbation: Secondary | ICD-10-CM

## 2020-03-05 DIAGNOSIS — R053 Chronic cough: Secondary | ICD-10-CM

## 2020-03-05 DIAGNOSIS — R0789 Other chest pain: Secondary | ICD-10-CM

## 2020-03-05 DIAGNOSIS — R05 Cough: Secondary | ICD-10-CM

## 2020-03-05 DIAGNOSIS — K219 Gastro-esophageal reflux disease without esophagitis: Secondary | ICD-10-CM

## 2020-03-05 LAB — CBC WITH DIFFERENTIAL/PLATELET
Basophils Absolute: 0.1 10*3/uL (ref 0.0–0.1)
Basophils Relative: 0.8 % (ref 0.0–3.0)
Eosinophils Absolute: 0.1 10*3/uL (ref 0.0–0.7)
Eosinophils Relative: 1.8 % (ref 0.0–5.0)
HCT: 42.1 % (ref 39.0–52.0)
Hemoglobin: 14.2 g/dL (ref 13.0–17.0)
Lymphocytes Relative: 23.5 % (ref 12.0–46.0)
Lymphs Abs: 1.6 10*3/uL (ref 0.7–4.0)
MCHC: 33.8 g/dL (ref 30.0–36.0)
MCV: 81.8 fl (ref 78.0–100.0)
Monocytes Absolute: 0.5 10*3/uL (ref 0.1–1.0)
Monocytes Relative: 7.7 % (ref 3.0–12.0)
Neutro Abs: 4.5 10*3/uL (ref 1.4–7.7)
Neutrophils Relative %: 66.2 % (ref 43.0–77.0)
Platelets: 263 10*3/uL (ref 150.0–400.0)
RBC: 5.15 Mil/uL (ref 4.22–5.81)
RDW: 13.1 % (ref 11.5–15.5)
WBC: 6.7 10*3/uL (ref 4.0–10.5)

## 2020-03-05 MED ORDER — BUDESONIDE-FORMOTEROL FUMARATE 160-4.5 MCG/ACT IN AERO: 2.0000 | INHALATION_SPRAY | Freq: Two times a day (BID) | RESPIRATORY_TRACT | 3 refills | Status: AC

## 2020-03-05 NOTE — Patient Instructions (Signed)
Nice meeting you today Mr. Finau  Recommendations: Checking labs today (CBC, IgE, D-dimer) Re-start Symbicort 160 - take two puffs twice daily (rinse mouth after)  Follow-up: 4-6 weeks with Dr. Craige Cotta or Waynetta Sandy NP

## 2020-03-05 NOTE — Assessment & Plan Note (Addendum)
-   Increased chest tightness/burning. Associated dyspnea on exertion. He is currently off of all asthma medications including ICS/LABA inhaler and Fasenra injections - Resuming Symbicort 160 two puffs twice daily  - Checking CBC with diff, IgE and D-dimer today. If D-dimer comes back elevated needs CTA to r/o PE

## 2020-03-05 NOTE — Assessment & Plan Note (Addendum)
-   Hx GERD with esophagitis - Continue Dexilant 60mg  qd and carafate 1gm QID - Following with GI

## 2020-03-05 NOTE — Progress Notes (Signed)
@Patient  ID: , male    DOB: Feb 02, 1981, 39 y.o.   MRN: [id number]  Chief Complaint  Patient presents with  . Follow-up    Occass. sob x 1 mth.,burning in throat area in the evenings    Referring provider: No ref. provider found  HPI: 39 year old male, never smoked.  Past medical history significant for allergic asthma, GERD with esophagitis, cough.  Patient of Dr. 46, last seen in office by Dr. Craige Cotta on 07/21/2017.  During this visit he was treated for acute asthma exacerbation with Z-Pak and prednisone. Symbicort was increased to 160 2 puffs twice daily.   03/05/2020 Patient presents today for regular office follow-up. Patient was receiving Fasenra injections up until July 2019. Patient has not been seen in our office in over 2-1/2 years. He stopped using Symbicort over a year ago and is also not currently on Fasenra injections any more. He reports "burning sensation or chest tightness" in his chest. This appears to be worse in the evenings. He has been seen by GI and not felt to be related to reflux. He is on Dexilant 60mg  and Carafate. Reports oxygen saturation will drop to 92-94% and HR will go up to 130 on exertion. He experiences some mild dyspnea with exertion and back discomfort. No other respiratory complaints. He has also been referred to cardiology by his PCP.    Allergies  Allergen Reactions  . Bactrim     Abdominal pain   . Ceclor [Cefaclor] Other (See Comments)    Abdominal pain  . Cephalexin Swelling  . Penicillins Swelling  . Clindamycin/Lincomycin Rash    Immunization History  Administered Date(s) Administered  . Janssen (J&J) SARS-COV-2 Vaccination 01/08/2020    Past Medical History:  Diagnosis Date  . Allergy   . Anxiety   . Asthma   . Complication of anesthesia    slow to wake up  . Her disease   . Herpes genitalis in men   . History of hiatal hernia   . History of kidney stones   . Hypertension   . Pneumonia    x4, none in past 10  years    Tobacco History: Social History   Tobacco Use  Smoking Status Never Smoker  Smokeless Tobacco Never Used   Counseling given: Not Answered   Outpatient Medications Prior to Visit  Medication Sig Dispense Refill  . amLODipine (NORVASC) 10 MG tablet Take 10 mg by mouth daily.    azelastine (ASTELIN) 0.1 % nasal spray Place 1 spray into both nostrils 2 (two) times daily. Use in each nostril as directed as needed    . Beclomethasone Diprop, Nasal, (QNASL NA) Place 80 mcg into the nose daily. As needed    . busPIRone (BUSPAR) 5 MG tablet Take 5 mg by mouth 2 (two) times daily.    . cetirizine (ZYRTEC) 10 MG tablet Take 1 tablet (10 mg total) by mouth daily. 30 tablet 11  . Desvenlafaxine ER (PRISTIQ) 50 MG TB24 Take 50 mg by mouth daily.    03/10/2020 dexlansoprazole (DEXILANT) 60 MG capsule Take 60 mg by mouth daily.    Marland Kitchen spironolactone (ALDACTONE) 25 MG tablet Take 25 mg by mouth daily.    Marland Kitchen amLODipine (NORVASC) 5 MG tablet Take 5 mg by mouth every evening.  (Patient not taking: Reported on 03/05/2020)    . Benralizumab (FASENRA) 30 MG/ML SOSY Inject into the skin every 30 (thirty) days.    . budesonide-formoterol (SYMBICORT) 160-4.5 MCG/ACT inhaler Inhale 2  puffs into the lungs 2 (two) times daily. 1 Inhaler 6  . busPIRone (BUSPAR) 15 MG tablet Take 15 mg by mouth every evening.     . dicyclomine (BENTYL) 20 MG tablet Take 20 mg by mouth daily.     . fluticasone (FLONASE) 50 MCG/ACT nasal spray 2 sprays by Both Nostrils route at bedtime.    Marland Kitchen guaiFENesin 200 MG tablet Take 1,200 mg by mouth every 4 (four) hours as needed for cough or to loosen phlegm.    Marland Kitchen HYDROcodone-acetaminophen (NORCO) 5-325 MG tablet Take 1 tablet by mouth every 4 (four) hours as needed for moderate pain. 20 tablet 0  . oxybutynin (DITROPAN XL) 10 MG 24 hr tablet Take 1 tablet (10 mg total) by mouth at bedtime. 20 tablet 0  . sertraline (ZOLOFT) 25 MG tablet Take 25 mg by mouth every evening.     . valACYclovir  (VALTREX) 1000 MG tablet Take 1 tablet (1,000 mg total) by mouth daily. (Patient taking differently: Take 1,000 mg by mouth daily as needed. ) 90 tablet 3   No facility-administered medications prior to visit.    Review of Systems  Review of Systems  Constitutional: Negative.   Respiratory: Positive for chest tightness. Negative for cough, shortness of breath and wheezing.        DOE  Cardiovascular: Negative for chest pain, palpitations and leg swelling.  Gastrointestinal:       Esophageal discomfort/burning    Physical Exam  BP 132/80 (BP Location: Left Arm, Cuff Size: Normal)   Pulse 84   Temp (!) 97.4 F (36.3 C) (Oral)   Ht 6' (1.829 m)   Wt 253 lb (114.8 kg)   SpO2 97%   BMI 34.31 kg/m  Physical Exam Constitutional:      General: He is not in acute distress.    Appearance: Normal appearance. He is obese. He is not ill-appearing.  HENT:     Head: Normocephalic and atraumatic.     Mouth/Throat:     Mouth: Mucous membranes are moist.     Pharynx: Oropharynx is clear. No oropharyngeal exudate or posterior oropharyngeal erythema.  Cardiovascular:     Rate and Rhythm: Normal rate and regular rhythm.  Pulmonary:     Effort: Pulmonary effort is normal.     Breath sounds: Normal breath sounds.     Comments: CTA, no wheezing heard on exam  Musculoskeletal:        General: Normal range of motion.  Skin:    General: Skin is warm and dry.  Neurological:     General: No focal deficit present.     Mental Status: He is alert and oriented to person, place, and time. Mental status is at baseline.  Psychiatric:        Mood and Affect: Mood normal.        Behavior: Behavior normal.        Thought Content: Thought content normal.        Judgment: Judgment normal.      Lab Results:  CBC    Component Value Date/Time   WBC 5.3 04/05/2017 1630   RBC 5.19 04/05/2017 1630   HGB 13.3 08/16/2017 0747   HCT 39.0 08/16/2017 0747   PLT 312.0 04/05/2017 1630   MCV 81.5  04/05/2017 1630   MCV 84.2 02/04/2013 1703   MCH 27.3 02/04/2013 1703   MCHC 33.3 04/05/2017 1630   RDW 12.6 04/05/2017 1630   LYMPHSABS 2.1 04/05/2017 1630   MONOABS 0.4 04/05/2017  1630   EOSABS 0.2 04/05/2017 1630   BASOSABS 0.1 04/05/2017 1630    BMET    Component Value Date/Time   NA 142 08/16/2017 0747   K 3.9 08/16/2017 0747   CL 103 08/16/2017 0747   CO2 27 04/05/2017 1630   GLUCOSE 103 (H) 08/16/2017 0747   BUN 11 08/16/2017 0747   CREATININE 0.90 08/16/2017 0747   CALCIUM 9.7 04/05/2017 1630    BNP No results found for: BNP  ProBNP No results found for: PROBNP  Imaging: No results found.   Assessment & Plan:   Allergic asthma - Increased chest tightness/burning. Associated dyspnea on exertion. He is currently off of all asthma medications including ICS/LABA inhaler and Fasenra injections - Resuming Symbicort 160 two puffs twice daily  - Checking CBC with diff, IgE and D-dimer today. If D-dimer comes back elevated needs CTA to r/o PE   GERD (gastroesophageal reflux disease) - Hx GERD with esophagitis - Continue Dexilant 60mg  qd and carafate 1gm QID - Following with GI     , NP 03/05/2020

## 2020-03-06 NOTE — Progress Notes (Signed)
Spoke with patient about lab result per E. Clent Ridges NP. All questions answered and patient expressed full understanding. Nothing further needed at this time.

## 2020-03-06 NOTE — Progress Notes (Signed)
Reviewed and agree with assessment/plan.   Coralyn Helling, MD [facility] Pulmonary/Critical Care 03/06/2020, 8:02 AM Pager:  [phone]

## 2020-03-10 LAB — IGE: IgE (Immunoglobulin E), Serum: 14 kU/L (ref <=114)

## 2020-03-10 LAB — D-DIMER, QUANTITATIVE: D-Dimer, Quant: <0.19 mcg/mL FEU (ref <0.50)

## 2020-04-07 ENCOUNTER — Ambulatory Visit: Payer: BLUE CROSS/BLUE SHIELD | Admitting: Primary Care

## 2022-01-05 ENCOUNTER — Ambulatory Visit: Admit: 2022-01-05 | Discharge: 2022-01-05 | Payer: BLUE CROSS/BLUE SHIELD | Attending: Medical | Primary: Family

## 2022-01-05 DIAGNOSIS — H9202 Otalgia, left ear: Secondary | ICD-10-CM

## 2022-01-05 MED ORDER — AZITHROMYCIN 250 MG PO TABS
250 MG | ORAL_TABLET | ORAL | 0 refills | Status: AC
Start: 2022-01-05 — End: 2022-01-10

## 2022-01-05 MED ORDER — DEXAMETHASONE SODIUM PHOSPHATE 10 MG/ML IJ SOLN
10 MG/ML | Freq: Once | INTRAMUSCULAR | Status: AC
Start: 2022-01-05 — End: 2022-01-05
  Administered 2022-01-05: 21:00:00 10 mg via INTRAMUSCULAR

## 2022-01-05 MED ORDER — PREDNISONE 20 MG PO TABS
20 MG | ORAL_TABLET | Freq: Two times a day (BID) | ORAL | 0 refills | Status: AC
Start: 2022-01-05 — End: 2022-01-10

## 2022-01-05 NOTE — Progress Notes (Signed)
Marcus Moreno (DOB:  10-25-1980) is a 41 y.o. male, here for evaluation of the following chief complaint(s):  Otalgia         ASSESSMENT/PLAN:  1. Left ear pain  -     dexamethasone (DECADRON) injection 10 mg; 10 mg, IntraMUSCular, ONCE, 1 dose, On Wed 01/05/22 at 1700  -     predniSONE (DELTASONE) 20 MG tablet; Take 1 tablet by mouth 2 times daily for 5 days, Disp-10 tablet, R-0Normal  -     azithromycin (ZITHROMAX) 250 MG tablet; Take 1 tablet by mouth See Admin Instructions for 5 days 500mg  on day 1 followed by 250mg  on days 2 - 5, Disp-6 tablet, R-0Normal  2. Acute dysfunction of left eustachian tube  -     dexamethasone (DECADRON) injection 10 mg; 10 mg, IntraMUSCular, ONCE, 1 dose, On Wed 01/05/22 at 1700  3. Acute effusion of left ear  -     predniSONE (DELTASONE) 20 MG tablet; Take 1 tablet by mouth 2 times daily for 5 days, Disp-10 tablet, R-0Normal  -     azithromycin (ZITHROMAX) 250 MG tablet; Take 1 tablet by mouth See Admin Instructions for 5 days 500mg  on day 1 followed by 250mg  on days 2 - 5, Disp-6 tablet, R-0Normal    IM Decadron given in office tonight, will send Zpak and Prednisone since [place] is about [place], reviewed supportive therapies and s/sx to monitor, encourage fluids and rest, advised F/U with ENT once he gets home for further management. Patient acknowledges understanding and is in agreement with plan, patient left clinic in stable condition    No results found for any visits on 01/05/22.     Return if symptoms worsen or fail to improve.         Subjective   SUBJECTIVE/OBJECTIVE:  HPI  Patient presents to clinic with complaint of left ear pain. States that he has been dealing with ear issues for almost 1 month now. Started with b/l inner and outer ear infections, treated with Levaquin and Prednisone. Then had cerumen impactions and TMJ. Was seen by ENT and they put him on more Prednisone since they didn't see any signs of infection. He also had a CT of the left side which was normal.  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 Has been told he has eustachian tube dysfunction, had a lot of ear infections when he was younger. He takes daily Flonase, Astelin, antihistamine for sinus/allergies. Has also been taking Sudafed for the flare up of his ear. None of this has been helping. States that [place] is going [place] in [place] days so was worried about making sure his ear got fixed before then.     No Known Allergies     Current Outpatient Medications   Medication Sig Dispense Refill    predniSONE (DELTASONE) 20 MG tablet Take 1 tablet by mouth 2 times daily for 5 days 10 tablet 0    azithromycin (ZITHROMAX) 250 MG tablet Take 1 tablet by mouth See Admin Instructions for 5 days 500mg  on day 1 followed by 250mg  on days 2 - 5 6 tablet 0     No current facility-administered medications for this visit.        History reviewed. No pertinent past medical history.     History reviewed. No  pertinent surgical history.           Review of Systems  Other than what was mentioned in HPI, ROS is negative            Objective   BP 132/74   Pulse 85   Temp 98.2 F (36.8 C)   Resp 18   Ht 6\' 1"  (1.854 m)   Wt 262 lb (118.8 kg)   SpO2 98%   BMI 34.57 kg/m    Physical Exam  Constitutional:       General: He is not in acute distress.     Appearance: He is not ill-appearing, toxic-appearing or diaphoretic.   HENT:      Head: Normocephalic and atraumatic.      Right Ear: Tympanic membrane, ear canal and external ear normal.      Left Ear: Ear canal and external ear normal.      Ears:      Comments: Left: TM bulging with clear effusion and mild inflammation over the ossicle     Nose: Congestion present.      Mouth/Throat:      Pharynx: No oropharyngeal exudate or posterior oropharyngeal erythema.   Pulmonary:      Effort: Pulmonary effort is normal. No respiratory distress.    Musculoskeletal:      Cervical back: Neck supple.   Lymphadenopathy:      Cervical: No cervical adenopathy.   Skin:     General: Skin is warm and dry.   Neurological:      Mental Status: He is alert and oriented to person, place, and time.      Gait: Gait normal.                  An electronic signature was used to authenticate this note.    -- , PA-C
